# Patient Record
Sex: Female | Born: 2000
Health system: Southern US, Community
[De-identification: ages and names within clinical notes are randomized; demographics above are authoritative.]

## PROBLEM LIST (undated history)

## (undated) ENCOUNTER — Inpatient Hospital Stay (HOSPITAL_COMMUNITY): Payer: Medicaid Other

## (undated) DIAGNOSIS — D649 Anemia, unspecified: Secondary | ICD-10-CM

## (undated) HISTORY — DX: Anemia, unspecified: D64.9

## (undated) HISTORY — PX: NO PAST SURGERIES: SHX2092

---
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
## 2020-09-14 ENCOUNTER — Ambulatory Visit (INDEPENDENT_AMBULATORY_CARE_PROVIDER_SITE_OTHER): Payer: Medicaid Other | Admitting: Obstetrics

## 2020-09-14 ENCOUNTER — Encounter: Payer: Self-pay | Admitting: Obstetrics

## 2020-09-14 ENCOUNTER — Other Ambulatory Visit: Payer: Self-pay

## 2020-09-14 ENCOUNTER — Other Ambulatory Visit (HOSPITAL_COMMUNITY)
Admission: RE | Admit: 2020-09-14 | Discharge: 2020-09-14 | Disposition: A | Discharge status: Home / Self Care | Payer: Medicaid Other | Source: Ambulatory Visit | Attending: Obstetrics | Admitting: Obstetrics

## 2020-09-14 VITALS — BP 119/74 | HR 101 | Ht 66.0 in | Wt 174.1 lb

## 2020-09-14 DIAGNOSIS — Z3402 Encounter for supervision of normal first pregnancy, second trimester: Secondary | ICD-10-CM

## 2020-09-14 DIAGNOSIS — Z3A18 18 weeks gestation of pregnancy: Secondary | ICD-10-CM

## 2020-09-14 DIAGNOSIS — Z23 Encounter for immunization: Secondary | ICD-10-CM

## 2020-09-14 DIAGNOSIS — O99619 Diseases of the digestive system complicating pregnancy, unspecified trimester: Secondary | ICD-10-CM

## 2020-09-14 DIAGNOSIS — Z3400 Encounter for supervision of normal first pregnancy, unspecified trimester: Secondary | ICD-10-CM | POA: Diagnosis not present

## 2020-09-14 DIAGNOSIS — K5900 Constipation, unspecified: Secondary | ICD-10-CM

## 2020-09-14 MED ORDER — VITAFOL ULTRA 29-0.6-0.4-200 MG PO CAPS: 1.0000 | ORAL_CAPSULE | Freq: Every day | ORAL | 12 refills | Status: AC

## 2020-09-14 MED ORDER — BLOOD PRESSURE KIT DEVI: 1.0000 | 0 refills | Status: DC | PRN

## 2020-09-14 MED ORDER — DOCUSATE SODIUM 100 MG PO CAPS: 100.0000 mg | ORAL_CAPSULE | Freq: Two times a day (BID) | ORAL | 5 refills | Status: DC

## 2020-09-14 NOTE — Progress Notes (Signed)
Subjective:    Kari Dunn is being seen today for her first obstetrical visit.  This is not a planned pregnancy. She is at [redacted]w[redacted]d gestation. Her obstetrical history is significant for none. Relationship with FOB: significant other, not living together. Patient does intend to breast feed. Pregnancy history fully reviewed.  The information documented in the HPI was reviewed and verified.  Menstrual History: OB History    Gravida  1   Para      Term      Preterm      AB      Living  0     SAB      TAB      Ectopic      Multiple      Live Births               Patient's last menstrual period was 05/07/2020.    History reviewed. No pertinent past medical history.  Past Surgical History:  Procedure Laterality Date  . broken arm  2013    (Not in a hospital admission)  No Known Allergies  Social History   Tobacco Use  . Smoking status: Never Smoker  Substance Use Topics  . Alcohol use: Not Currently    Family History  Problem Relation Age of Onset  . Hypertension Mother      Review of Systems Constitutional: negative for weight loss Gastrointestinal: negative for vomiting.  Positive for constipation Genitourinary:negative for genital lesions and vaginal discharge and dysuria Musculoskeletal:negative for back pain Behavioral/Psych: negative for abusive relationship, depression, illegal drug usage and tobacco use    Objective:    BP 119/74   Pulse (!) 101   Ht $R'5\' 6"'YD$  (1.676 m)   Wt 174 lb 1.6 oz (79 kg)   LMP 05/07/2020   BMI 28.10 kg/m  General Appearance:    Alert, cooperative, no distress, appears stated age  Head:    Normocephalic, without obvious abnormality, atraumatic  Eyes:    PERRL, conjunctiva/corneas clear, EOM's intact, fundi    benign, both eyes  Ears:    Normal TM's and external ear canals, both ears  Nose:   Nares normal, septum midline, mucosa normal, no drainage    or sinus tenderness  Throat:   Lips, mucosa, and tongue normal;  teeth and gums normal  Neck:   Supple, symmetrical, trachea midline, no adenopathy;    thyroid:  no enlargement/tenderness/nodules; no carotid   bruit or JVD  Back:     Symmetric, no curvature, ROM normal, no CVA tenderness  Lungs:     Clear to auscultation bilaterally, respirations unlabored  Chest Wall:    No tenderness or deformity   Heart:    Regular rate and rhythm, S1 and S2 normal, no murmur, rub   or gallop  Breast Exam:    No tenderness, masses, or nipple abnormality  Abdomen:     Soft, non-tender, bowel sounds active all four quadrants,    no masses, no organomegaly  Genitalia:    Normal female without lesion, discharge or tenderness  Extremities:   Extremities normal, atraumatic, no cyanosis or edema  Pulses:   2+ and symmetric all extremities  Skin:   Skin color, texture, turgor normal, no rashes or lesions  Lymph nodes:   Cervical, supraclavicular, and axillary nodes normal  Neurologic:   CNII-XII intact, normal strength, sensation and reflexes    throughout      Lab Review Urine pregnancy test Labs reviewed yes Radiologic studies reviewed yes  Assessment:    Pregnancy at 42w4dweeks    Plan:      1. Supervision of normal first pregnancy, antepartum Rx: - Prenat-Fe Poly-Methfol-FA-DHA (VITAFOL ULTRA) 29-0.6-0.4-200 MG CAPS; Take 1 tablet by mouth daily.  Dispense: 30 capsule; Refill: 12 - Flu Vaccine QUAD 36+ mos IM (Fluarix, Quad PF) - CBC/D/Plt+RPR+Rh+ABO+Rub Ab... - Culture, OB Urine - Genetic Screening - Cervicovaginal ancillary only( [place]) - Babyscripts Schedule Optimization - [place] OB DETAIL +14 WK; Future - Blood Pressure Monitoring (BLOOD PRESSURE KIT) DEVI; 1 kit by Does not apply route as needed.  Dispense: 1 each; Refill: 0  2. Constipation during pregnancy, antepartum Rx: - docusate sodium (COLACE) 100 MG capsule; Take 1 capsule (100 mg total) by mouth 2 (two) times daily.  Dispense: 60 capsule; Refill: 5  Prenatal vitamins.   Counseling provided regarding continued use of seat belts, cessation of alcohol consumption, smoking or use of illicit drugs; infection precautions i.e., influenza/TDAP immunizations, toxoplasmosis,CMV, parvovirus, listeria and varicella; workplace safety, exercise during pregnancy; routine dental care, safe medications, sexual activity, hot tubs, saunas, pools, travel, caffeine use, fish and methlymercury, potential toxins, hair treatments, varicose veins Weight gain recommendations per IOM guidelines reviewed: underweight/BMI< 18.5--> gain 28 - 40 lbs; normal weight/BMI 18.5 - 24.9--> gain 25 - 35 lbs; overweight/BMI 25 - 29.9--> gain 15 - 25 lbs; obese/BMI >30->gain  11 - 20 lbs Problem list reviewed and updated. FIRST/CF mutation testing/NIPT/QUAD SCREEN/fragile X/Ashkenazi Jewish population testing/Spinal muscular atrophy discussed: requested. Role of ultrasound in pregnancy discussed; fetal survey: requested. Amniocentesis discussed: not indicated.  Follow up in 4 weeks. 50% of 20 min visit spent on counseling and coordination of care.    HShelly Bombard MD 09/14/2020 11:00 AM

## 2020-09-14 NOTE — Progress Notes (Signed)
NOB in office with sister, late to care, no complaints, pregnancy confirmed in [place], pt reports that FOB is involved.

## 2020-09-15 LAB — CERVICOVAGINAL ANCILLARY ONLY
Bacterial Vaginitis (gardnerella): NEGATIVE
Candida Glabrata: NEGATIVE
Candida Vaginitis: POSITIVE — AB
Chlamydia: NEGATIVE
Comment: NEGATIVE
Comment: NEGATIVE
Comment: NEGATIVE
Comment: NEGATIVE
Comment: NEGATIVE
Comment: NORMAL
Neisseria Gonorrhea: NEGATIVE
Trichomonas: NEGATIVE

## 2020-09-16 ENCOUNTER — Other Ambulatory Visit: Payer: Self-pay | Admitting: Obstetrics

## 2020-09-16 DIAGNOSIS — B3731 Acute candidiasis of vulva and vagina: Secondary | ICD-10-CM

## 2020-09-16 DIAGNOSIS — B373 Candidiasis of vulva and vagina: Secondary | ICD-10-CM

## 2020-09-16 DIAGNOSIS — O99019 Anemia complicating pregnancy, unspecified trimester: Secondary | ICD-10-CM

## 2020-09-16 LAB — CBC/D/PLT+RPR+RH+ABO+RUB AB...
Antibody Screen: NEGATIVE
Basophils Absolute: 0 10*3/uL (ref 0.0–0.2)
Basos: 0 %
EOS (ABSOLUTE): 0.2 10*3/uL (ref 0.0–0.4)
Eos: 2 %
HCV Ab: <0.1 s/co ratio (ref 0.0–0.9)
HIV Screen 4th Generation wRfx: NONREACTIVE
Hematocrit: 32.3 % — ABNORMAL LOW (ref 34.0–46.6)
Hemoglobin: 11 g/dL — ABNORMAL LOW (ref 11.1–15.9)
Hepatitis B Surface Ag: NEGATIVE
Immature Grans (Abs): 0.1 10*3/uL (ref 0.0–0.1)
Immature Granulocytes: 1 %
Lymphocytes Absolute: 1.7 10*3/uL (ref 0.7–3.1)
Lymphs: 20 %
MCH: 26.3 pg — ABNORMAL LOW (ref 26.6–33.0)
MCHC: 34.1 g/dL (ref 31.5–35.7)
MCV: 77 fL — ABNORMAL LOW (ref 79–97)
Monocytes Absolute: 0.5 10*3/uL (ref 0.1–0.9)
Monocytes: 6 %
Neutrophils Absolute: 6.1 10*3/uL (ref 1.4–7.0)
Neutrophils: 71 %
Platelets: 329 10*3/uL (ref 150–450)
RBC: 4.18 x10E6/uL (ref 3.77–5.28)
RDW: 15.7 % — ABNORMAL HIGH (ref 11.7–15.4)
RPR Ser Ql: NONREACTIVE
Rh Factor: POSITIVE
Rubella Antibodies, IGG: <0.9 index — ABNORMAL LOW (ref >0.99)
WBC: 8.5 10*3/uL (ref 3.4–10.8)

## 2020-09-16 LAB — URINE CULTURE, OB REFLEX

## 2020-09-16 LAB — CULTURE, OB URINE

## 2020-09-16 LAB — HCV INTERPRETATION

## 2020-09-16 MED ORDER — TERCONAZOLE 0.4 % VA CREA: 1.0000 | TOPICAL_CREAM | Freq: Every day | VAGINAL | 0 refills | Status: DC

## 2020-09-16 MED ORDER — FERROUS SULFATE 325 (65 FE) MG PO TABS: 325.0000 mg | ORAL_TABLET | Freq: Two times a day (BID) | ORAL | 5 refills | Status: DC

## 2020-09-18 ENCOUNTER — Telehealth: Payer: Self-pay

## 2020-09-18 NOTE — Telephone Encounter (Signed)
Called patient to inform of yeast infection and the need to start medication.

## 2020-09-20 ENCOUNTER — Other Ambulatory Visit: Payer: Self-pay

## 2020-09-20 ENCOUNTER — Other Ambulatory Visit: Payer: Medicaid Other

## 2020-09-25 ENCOUNTER — Encounter: Payer: Self-pay | Admitting: Obstetrics

## 2020-09-27 ENCOUNTER — Encounter: Payer: Self-pay | Admitting: Obstetrics

## 2020-10-04 ENCOUNTER — Ambulatory Visit: Payer: Medicaid Other | Admitting: *Deleted

## 2020-10-04 ENCOUNTER — Ambulatory Visit: Payer: Medicaid Other | Attending: Obstetrics

## 2020-10-04 ENCOUNTER — Other Ambulatory Visit: Payer: Self-pay | Admitting: *Deleted

## 2020-10-04 ENCOUNTER — Other Ambulatory Visit: Payer: Self-pay

## 2020-10-04 ENCOUNTER — Encounter: Payer: Self-pay | Admitting: *Deleted

## 2020-10-04 VITALS — BP 121/71 | HR 92

## 2020-10-04 DIAGNOSIS — D649 Anemia, unspecified: Secondary | ICD-10-CM | POA: Diagnosis not present

## 2020-10-04 DIAGNOSIS — O0930 Supervision of pregnancy with insufficient antenatal care, unspecified trimester: Secondary | ICD-10-CM

## 2020-10-04 DIAGNOSIS — O99012 Anemia complicating pregnancy, second trimester: Secondary | ICD-10-CM

## 2020-10-04 DIAGNOSIS — Z3A21 21 weeks gestation of pregnancy: Secondary | ICD-10-CM

## 2020-10-04 DIAGNOSIS — O0932 Supervision of pregnancy with insufficient antenatal care, second trimester: Secondary | ICD-10-CM | POA: Diagnosis not present

## 2020-10-04 DIAGNOSIS — Z3400 Encounter for supervision of normal first pregnancy, unspecified trimester: Secondary | ICD-10-CM | POA: Insufficient documentation

## 2020-10-04 DIAGNOSIS — Z148 Genetic carrier of other disease: Secondary | ICD-10-CM

## 2020-10-04 DIAGNOSIS — Z363 Encounter for antenatal screening for malformations: Secondary | ICD-10-CM

## 2020-10-06 ENCOUNTER — Encounter: Payer: Self-pay | Admitting: Obstetrics

## 2020-10-12 ENCOUNTER — Ambulatory Visit (INDEPENDENT_AMBULATORY_CARE_PROVIDER_SITE_OTHER): Payer: Medicaid Other | Admitting: Nurse Practitioner

## 2020-10-12 ENCOUNTER — Encounter: Payer: Self-pay | Admitting: Nurse Practitioner

## 2020-10-12 ENCOUNTER — Other Ambulatory Visit: Payer: Self-pay

## 2020-10-12 VITALS — BP 115/74 | HR 93 | Wt 185.0 lb

## 2020-10-12 DIAGNOSIS — D563 Thalassemia minor: Secondary | ICD-10-CM | POA: Insufficient documentation

## 2020-10-12 DIAGNOSIS — Z3400 Encounter for supervision of normal first pregnancy, unspecified trimester: Secondary | ICD-10-CM

## 2020-10-12 DIAGNOSIS — Z3A22 22 weeks gestation of pregnancy: Secondary | ICD-10-CM

## 2020-10-12 DIAGNOSIS — O99619 Diseases of the digestive system complicating pregnancy, unspecified trimester: Secondary | ICD-10-CM

## 2020-10-12 DIAGNOSIS — K5900 Constipation, unspecified: Secondary | ICD-10-CM

## 2020-10-12 NOTE — Progress Notes (Signed)
Pt presents for ROB without complaints today.  

## 2020-10-12 NOTE — Progress Notes (Signed)
q   Subjective:  Kari Dunn is a 19 y.o. G1P0 at [redacted]w[redacted]d being seen today for ongoing prenatal care.  She is currently monitored for the following issues for this low-risk pregnancy and has Supervision of normal first pregnancy, antepartum and Alpha thalassemia silent carrier on their problem list.  Patient reports no complaints.  Contractions: Not present. Vag. Bleeding: None.  Movement: Present. Denies leaking of fluid.   The following portions of the patient's history were reviewed and updated as appropriate: allergies, current medications, past family history, past medical history, past social history, past surgical history and problem list. Problem list updated.  Objective:   Vitals:   10/12/20 0850  BP: 115/74  Pulse: 93  Weight: 185 lb (83.9 kg)    Fetal Status: Fetal Heart Rate (bpm): 145 Fundal Height: 22 cm Movement: Present     General:  Alert, oriented and cooperative. Patient is in no acute distress.  Skin: Skin is warm and dry. No rash noted.   Cardiovascular: Normal heart rate noted  Respiratory: Normal respiratory effort, no problems with respiration noted  Abdomen: Soft, gravid, appropriate for gestational age. Pain/Pressure: Absent     Pelvic:  Cervical exam deferred        Extremities: Normal range of motion.  Edema: None  Mental Status: Normal mood and affect. Normal behavior. Normal judgment and thought content.   Urinalysis:      Assessment and Plan:  Pregnancy: G1P0 at [redacted]w[redacted]d  1. Supervision of normal first pregnancy, antepartum Has not picked up BP cuff yet Encouraged to sign up for breastfeeding and childbirth classes Too late for AFP Discussed Alpha Thal carrier - Advised to call [employer] to set up partner testing  2. Constipation during pregnancy, antepartum Improving Drink at least 8 8-oz glasses of water every day. Eat high fiber diet  3. Alpha Thal Silent Carrier  Preterm labor symptoms and general obstetric precautions including but not  limited to vaginal bleeding, contractions, leaking of fluid and fetal movement were reviewed in detail with the patient. Please refer to After Visit Summary for other counseling recommendations.  Return in about 3 weeks (around 11/02/2020) for in person ROB.  Nolene Bernheim, RN, MSN, NP-BC Nurse Practitioner, [facility] for [employer], [facility] 10/12/2020 4:14 PM

## 2020-10-12 NOTE — Patient Instructions (Signed)
AREA PEDIATRIC/FAMILY PRACTICE PHYSICIANS  [place] ([address]) . [facility] o Chambliss, MD; Eniola, MD; Hale, MD; Hensel, MD; McDiarmid, MD; McIntyer, MD; Neal, MD; Walden, MD o [address] [place], [place] [address] o [phone] o Mon-Fri 8:30-12:30, 1:30-5:00 o Providers come to see babies at [facility] o Accepting Medicaid . [facility] o Limited providers who accept newborns: Koirala, MD; Morrow, MD; Wolters, MD o [address], [place], [place] [address] o [phone] o Mon-Fri 8:00-5:30 o Babies seen by providers at [facility] o Does NOT accept Medicaid o Please call early in hospitalization for appointment (limited availability)  . [facility] o Mulberry, MD o [address] [place], [place] [address] o [phone] o Mon, Tue, Thur, Fri 8:30-5:00, Wed 10:00-7:00 (closed 1-2pm) o Babies seen by [facility] providers o Accepting Medicaid . [facility] - Pediatrician o Rubin, MD o [address], [place], [place] [address] o [phone] o Mon-Fri 8:30-5:00, Sat 8:30-12:00 o Provider comes to see babies at [facility] o Accepting Medicaid o Must have been referred from current patients or contacted office prior to delivery . [facility] ([facility] o Brown, MD; Chandler, MD; Ettefagh, MD; Grant, MD; Lester, MD; McCormick, MD; McQueen, MD; Prose, MD; Simha, MD; Stanley, MD; Stryffeler, NP; Tebben, NP o [address], [place], [place] [address] o [phone] o Mon, Tue, Thur, Fri 8:30-5:30, Wed 9:30-5:30, Sat 8:30-12:30 o Babies seen by [facility] providers o Accepting Medicaid o Only accepting infants of first-time parents or siblings of current patients o Hospital discharge coordinator will make follow-up appointment . Jack Amos o [address],  [place], [place]  [address] o [phone]   Fax - [phone] . [facility] o [address], [place], [place]  [address] o Phone - [phone]   Fax - [phone] . Shilpa Gosrani o [address], [place], [place]  [address] o [phone]  [place] ([address]) . [facility] o Bates, MD; [place], MD; Cooper, Cox, MD; MD; Davis, MD; Dovico, MD; Ettefaugh, MD; Little, MD; Lowe, MD; Keiffer, MD; Melvin, MD; Sumner, MD; Williams, MD o [address], [place], [place] [address] o [phone] o Mon-Fri 8:30-5:00 (extended evenings Mon-Thur as needed), Sat-Sun 10:00-1:00 o Providers come to see babies at [facility] o Accepting Medicaid for families of first-time babies and families with all children in the household age 3 and under. Must register with office prior to making appointment (M-F only). . [facility] o Henson, NP; Knapp, MD; Lalonde, MD; Tysinger, PA o [address] [place], [place] [address] o [phone] o Mon-Fri 8:00-5:00 o Babies seen by providers at [facility] o Does NOT accept Medicaid/Commercial Insurance Only . [facility] [place] ([facility]  o Artis, MD; Barnes, MD; Bratton, MD; Coccaro, MD; Lockett Gardner, MD; Kramer, MD; Marshall, MD; Netherton, MD; Poleto, MD; Skinner, MD o [address] [place], [place] [address] o [phone] o Mon-Fri 8:30-5:30, Sat (Oct.-Mar.) 9:00-1:00 o Babies seen by providers at [facility] o Accepting Medicaid  [place] ([address]) . [facility] o Reid, MD; Warner, MD o [address], [place],  [address] o [phone] o Mon-Fri 8:30-5:00, Sat 8:30-12:00 o Providers come to see babies at [facility] o Does NOT accept Medicaid . [facility] o Becker, PA; Hagler, MD; Scifres, PA; Sun, MD; Swayne, MD o [address],  [place], [place] [address] o [phone] o Mon-Fri 8:00-5:00 o Babies seen by providers at [facility] o Does NOT accept Medicaid o Only accepting babies of parents who are patients o Please call early in hospitalization for appointment (limited availability) . [facility] o Clark, MD; Frye, MD; Kelleher, MD; Mack, NP; Miller, MD; O'Keller, MD; Patterson, NP; Pudlo, MD; Puzio, MD; Thomas, MD; Tucker, MD; Twiselton, MD o [address], [place], [place] [address] o [phone] o Mon-Fri 8:00-5:00, Sat 9:00-12:00 o Providers come to see babies at [facility] o Does NOT accept Medicaid  [place] ([address]) . [facility] o Limited providers accepting new patients: Brake, NP; Wharton, PA o [address], [place], [place] [address] o [phone] o Mon-Fri 8:00-5:00 o Babies seen by providers at [facility] o Does NOT accept Medicaid o Only accepting babies of parents who are patients o Please call early in hospitalization for appointment (limited availability) . [facility] o Gay, MD; Quinlan, MD o [address] [place], [place] [address] o [phone] (press 1 to schedule appointment) o Mon-Fri 8:00-5:00 o Providers come to see babies at [facility] o Does NOT accept Medicaid . [facility] o Mazer, MD o [address] [place], [place] [address] o [phone] o Mon-Fri 8:30-5:00 (lunch 12:30-1:00), extended hours by appointment only Wed 5:00-6:30 o Babies seen by [facility] providers o Accepting Medicaid . [facility] o Banks, MD; Jordan, MD; Koberlein, MD o [address], [place], [place] [address] o [phone] o Mon-Fri 8:00-5:00 o Babies seen by [facility] providers o Does NOT accept Medicaid . [facility] o Parker, MD; Hunter, MD; Wallace, DO o [address] [place], [place]  [address] o [phone] o Mon-Fri 8:00-5:00 o Babies seen by [facility] providers o Does NOT accept Medicaid . [facility] o Brandon, PA; Brecken, PA; Christy, NP; Dees, MD; DeClaire, MD; DeWeese, MD; Hansen, NP; Mills, NP; Parrish, NP; Smoot, NP; Summer, MD; Vapne, MD o [address] [place], [place] [address] o [phone] o Mon-Fri 8:30-5:00, Sat 10:00-1:00 o Providers come to see babies at [facility] o Does NOT accept Medicaid o Free prenatal information session Tuesdays at 4:45pm . [facility] o Bouska, MD; Gordon, PA; Jeffery, PA; Weber, PA o [address] [place] [address] o [phone] o Mon-Fri 7:30-5:30 o Babies seen by [facility] providers . [facility] o [address], [place], [place], [place]  [address] o [phone]   Fax - [phone]  [place] ([phone]) . [facility] o Reese, [place] o [address] [place], [place] [address] o [phone] o Mon-Thur 8:00-6:00 o Providers come to see babies at [facility] o Accepting Medicaid . [facility] o Anderson, NP; Badger, MD; Beal, PA; Spencer, PA o [address] [place],  [address] o [phone] o Mon-Thur 7:30-7:30, Fri 7:30-4:30 o Babies seen by [facility] providers o Accepting Medicaid . [facility] o Agbuya, MD; Klett, NP; Romgoolam, MD o [address], ,  [address] o [phone] o Mon-Fri 8:30-5:00, Sat 8:30-12:00 o Providers come to see babies at [facility] o Accepting Medicaid o Must have "Meet & Greet" appointment at office prior to delivery . [facility]  ([facility] ) o McCord,   MD; Juleen China, MD; Clydene Laming, [address], [place], [place] [address] o [phone] o Mon-Wed 8:00-6:00, Thur-Fri 8:00-5:00, Sat 9:00-12:00 o Providers come to  see babies at [facility] o Does NOT accept Medicaid o Only accepting siblings of current patients . [facility] [place], [place], [place], [place]  [address] o [phone]   Fax [phone] . [facility] [place] [address], [place], [place]  [address] o [phone]   Fax - Morton Gorman [phone] & [phone]) . Therapist, music at [place], DO; [place], [address] [place], [place] [address] o [phone] o Mon-Fri 7:00-5:00 o Babies seen by [facility] providers o Does NOT accept Medicaid . [place], MD; [place], [place]; [place], [place], [place], [place] [address] o [phone] o Mon-Fri 8:00-5:00 o Babies seen by [facility] providers o Accepting Medicaid . Lamont, MD; [place], [place]; [place], NP; [place], [place], [place], [place] [address] o [phone] o Mon-Fri 8:00-5:00 o Babies seen by providers at [facility] [place] [phone]) . [facility] [place], [place] o [address] [place], [place] [address] o [phone] o Mon-Fri 8:00-5:00 o Babies seen by [facility] providers o Does NOT accept Medicaid o Limited availability, please call early in hospitalization to schedule follow-up . [facility] Leilani Merl, PA; Maisie Fus, MD; [place], MD; [place], [place]; Jeannine Kitten, MD; [place], [place] [facility] [address], [place], [place] [address] o [phone] o Mon-Fri 8:30-5:00, Sat 9:00-12:00 o Babies seen by providers at [facility] o Accepting Medicaid o Please register online then schedule online or call office o [email or web address] . [place] [facility]  [place]) Kristian Covey, NP; Dwyane Dee, MD; Leonidas Romberg, PA o [address] [place], [place], [place] [address] o [phone] o Mon-Fri 8:00-5:00 o Babies seen by providers at [facility] o Accepting Medicaid . [place] ([facility] [employer]) Dairl Ponder, MD; Rayvon Char, NP; Melina Modena, MD o [address] [place], [place], [place] [address] o [phone] o Mon-Fri 8:00-5:30, Sat&Sun by appointment (phones open at 8:30) o Babies seen by [facility] providers o Accepting Medicaid o Must be a first-time baby or sibling of current patient . [place], [address], [place], [place]  [address] o [phone]   Fax - [phone]  [place] [phone] & [phone]) . [place], [place]; [place], [place]; Benjamine Mola, MD; [place], [place]; Harrell Lark, MD o [address] [place], [place] [address] o [phone] o Mon-Thur 8:00-7:00, Fri 8:00-5:00, Sat 8:00-12:00, Sun 9:00-12:00 o Babies seen by [facility] providers o Accepting Medicaid . [facility] at [facility], MD; Ruthann Cancer, MD; [facility], MD o [address], [place], [place] [address] o [phone] o Mon-Thur 8:00-5:00 o Babies seen by providers at [facility] o Accepting Medicaid . [facility] [place], MD; [place], MD; Amedeo Plenty, MD; Bobby Rumpf, MD; List, MD; Lavonia Drafts, MD; Ruthann Cancer, MD; Selinda Eon, MD; Audie Box, MD; Jim Like, MD; Christie Nottingham, MD; Hubbard Hartshorn, MD; Modena Nunnery, MD o [address] [place], [place]  [address] o [phone] o Mon-Fri 8:00-5:30, Sat (Oct.-Mar.) 9:00-1:00 o Babies seen by providers at [facility] o Accepting Medicaid o Must fill out new patient packet, available online at [email or web address] . [facility] [place] [facility]) Simone Curia, NP; Tiburcio Pea, NP; Tresa Endo, NP; Whitney Post, MD;  [place], [place]; Hennie Duos, MD; Wynne Dust, MD; Kavin Leech, NP o [address], [place], [place] [address] o [phone] o Mon-Thur 8:00-5:30, Fri 8:00-5:00 o Babies seen by providers at [facility] o Accepting [facility] [phone]) . [facility] o [place], [place]; D[place]bach, MD; Tanya Nones, MD; [place], [place] o [address] [place], [place] [address] o [phone] o Mon-Fri 8:00-5:00 o Babies seen by providers at [facility] o Accepting [facility] [phone]) . [facility] o [place], DO; Lenise Arena, MD; [place], [place] o [address], [place], [place] [address] o [phone] o Mon-Fri 8:00-5:00 o Babies seen by providers at [facility] o Does NOT accept Medicaid o Limited appointment availability, please call early in hospitalization  . Nature conservation officer at [facility] o [place], DO; [place], MD o [address], [place], [place] [address] o [phone] o Mon-Fri 8:00-5:00 o Babies seen by [facility] providers o Does NOT accept Medicaid . [facility] Lorrine Kin, MD; Ninetta Lights, MD; [place], [place]; [place], MD o [address], [place], [place] [address] o [phone] o Mon-Fri 8:00-5:00 o After hours clinic [facility] [place], [place] [address]) [phone] Mon-Fri 5:00-8:00, Sat 12:00-6:00, Sun 10:00-4:00 o Babies seen by [facility] providers o Accepting Medicaid . [facility] at [facility] o [address] [place], [place]  [address] o [phone]   Fax - [phone]  [place] [phone]) . Nature conservation officer at [facility], MD o [address] [place], [place], [place] [address] o [phone] o Mon-Fri 8:00-5:00 o Babies seen by [facility] providers o Does NOT accept Medicaid . [facility] [place] [facility] [place]) Tomi Likens, MD o [address] [place] [address], [place], [place]  [address] o [phone] o Mon-Thur 8:00-7:00, Fri 8:00-5:00, Sat 8:00-12:00 o Babies seen by providers at [facility] o Accepting Medicaid - but does not have vaccinations in office (must be received elsewhere) o Limited availability, please call early in hospitalization  [place] ([address]) . [facility]  o Wyvonne Lenz, MD o [address], [place] [address] o [phone]  Fax [phone]   Check out Covid vaccine info here:   [email or web address]  7 Things You Should Know About the COVID-19 Vaccines  Here are seven facts you should know before taking your shot:  1.  No serious side effects were reported in clinical trials. Temporary reactions after receiving the vaccine may include a sore arm, headache, feeling tired and achy for a day or two or, in some cases, fever. In most cases, these reactions are good signs that your body is building protection.   2.  Scientists had a head start. They are built on decades of research on vaccines for similar viruses. A big investment of resources and focus made sure they were created without skipping any steps in development, testing, or clinical trials.  3. You cannot get COVID-19 from the vaccine. The vaccine gives your body instructions to make a protein that safely teaches you to make germ-fighting antibodies to fight the real COVID-19.  4.The vaccine  protects against the Delta variant. The Delta variant, which is now predominant in [place], is much more contagious than the original virus. Vaccines continue to be remarkably effective in reducing risk of severe disease, hospitalization, and death, even against the Delta variant.  5. A hundred million people in the [place] have already received their COVID-19 vaccine.  6. It works. And once you're fully vaccinated you're protected. The vaccines are proven to help prevent COVID-19 and are effective in preventing hospitalization and death.   7. The  vaccine does not affect fertility. Vaccination for those who are pregnant or wanting to become pregnant is recommended by the [employer] of Obstetricians and Gynecologists (ACOG), the Society for Maternal-Fetal Medicine (SMFM), the American Society for Reproductive Medicine (ASRM), and the Society for Female Reproduction and Urology.   TDaP Vaccine Pregnancy Get the Whooping Cough Vaccine While You Are Pregnant (CDC)  It is important for women to get the whooping cough vaccine in the third trimester of each pregnancy. Vaccines are the best way to prevent this disease. There are 2 different whooping cough vaccines. Both vaccines combine protection against whooping cough, tetanus and diphtheria, but they are for different age groups: Tdap: for everyone 11 years or older, including pregnant women  DTaP: for children 2 months through 16 years of age  You need the whooping cough vaccine during each of your pregnancies The recommended time to get the shot is during your 27th through 36th week of pregnancy, preferably during the earlier part of this time period. The Centers for Disease Control and Prevention (CDC) recommends that pregnant women receive the whooping cough vaccine for adolescents and adults (called Tdap vaccine) during the third trimester of each pregnancy. The recommended time to get the shot is during your 27th through 36th week of pregnancy, preferably during the earlier part of this time period. This replaces the original recommendation that pregnant women get the vaccine only if they had not previously received it. The [employer] of Obstetricians and Gynecologists and the [employer] support this recommendation.  You should get the whooping cough vaccine while pregnant to pass protection to your baby frame support disabled and/or not supported in this browser  Learn why Vernona Rieger decided to get the whooping cough vaccine in her 3rd trimester of pregnancy and  how her baby girl was born with some protection against the disease. Also available on YouTube. After receiving the whooping cough vaccine, your body will create protective antibodies (proteins produced by the body to fight off diseases) and pass some of them to your baby before birth. These antibodies provide your baby some short-term protection against whooping cough in early life. These antibodies can also protect your baby from some of the more serious complications that come along with whooping cough. Your protective antibodies are at their highest about 2 weeks after getting the vaccine, but it takes time to pass them to your baby. So the preferred time to get the whooping cough vaccine is early in your third trimester. The amount of whooping cough antibodies in your body decreases over time. That is why CDC recommends you get a whooping cough vaccine during each pregnancy. Doing so allows each of your babies to get the greatest number of protective antibodies from you. This means each of your babies will get the best protection possible against this disease.  Getting the whooping cough vaccine while pregnant is better than getting the vaccine after you give birth Whooping cough vaccination during pregnancy  is ideal so your baby will have short-term protection as soon as he is born. This early protection is important because your baby will not start getting his whooping cough vaccines until he is 2 months old. These first few months of life are when your baby is at greatest risk for catching whooping cough. This is also when he's at greatest risk for having severe, potentially life-threating complications from the infection. To avoid that gap in protection, it is best to get a whooping cough vaccine during pregnancy. You will then pass protection to your baby before he is born. To continue protecting your baby, he should get whooping cough vaccines starting at 2 months old. You may never have gotten  the Tdap vaccine before and did not get it during this pregnancy. If so, you should make sure to get the vaccine immediately after you give birth, before leaving the hospital or birthing center. It will take about 2 weeks before your body develops protection (antibodies) in response to the vaccine. Once you have protection from the vaccine, you are less likely to give whooping cough to your newborn while caring for him. But remember, your baby will still be at risk for catching whooping cough from others. A recent study looked to see how effective Tdap was at preventing whooping cough in babies whose mothers got the vaccine while pregnant or in the hospital after giving birth. The study found that getting Tdap between 27 through 36 weeks of pregnancy is 85% more effective at preventing whooping cough in babies younger than 2 months old. Blood tests cannot tell if you need a whooping cough vaccine There are no blood tests that can tell you if you have enough antibodies in your body to protect yourself or your baby against whooping cough. Even if you have been sick with whooping cough in the past or previously received the vaccine, you still should get the vaccine during each pregnancy. Breastfeeding may pass some protective antibodies onto your baby By breastfeeding, you may pass some antibodies you have made in response to the vaccine to your baby. When you get a whooping cough vaccine during your pregnancy, you will have antibodies in your breast milk that you can share with your baby as soon as your milk comes in. However, your baby will not get protective antibodies immediately if you wait to get the whooping cough vaccine until after delivering your baby. This is because it takes about 2 weeks for your body to create antibodies. Learn more about the health benefits of breastfeeding.

## 2020-11-02 ENCOUNTER — Encounter: Payer: Self-pay | Admitting: Nurse Practitioner

## 2020-11-02 ENCOUNTER — Other Ambulatory Visit: Payer: Self-pay

## 2020-11-02 ENCOUNTER — Ambulatory Visit (INDEPENDENT_AMBULATORY_CARE_PROVIDER_SITE_OTHER): Payer: Medicaid Other | Admitting: Nurse Practitioner

## 2020-11-02 VITALS — BP 119/83 | HR 91 | Wt 195.2 lb

## 2020-11-02 DIAGNOSIS — Z3400 Encounter for supervision of normal first pregnancy, unspecified trimester: Secondary | ICD-10-CM

## 2020-11-02 DIAGNOSIS — O99619 Diseases of the digestive system complicating pregnancy, unspecified trimester: Secondary | ICD-10-CM

## 2020-11-02 DIAGNOSIS — K5900 Constipation, unspecified: Secondary | ICD-10-CM

## 2020-11-02 DIAGNOSIS — R12 Heartburn: Secondary | ICD-10-CM

## 2020-11-02 DIAGNOSIS — Z3A25 25 weeks gestation of pregnancy: Secondary | ICD-10-CM

## 2020-11-02 DIAGNOSIS — O26899 Other specified pregnancy related conditions, unspecified trimester: Secondary | ICD-10-CM

## 2020-11-02 MED ORDER — OMEPRAZOLE 20 MG PO CPDR: 20.0000 mg | DELAYED_RELEASE_CAPSULE | Freq: Every day | ORAL | 1 refills | Status: DC

## 2020-11-02 NOTE — Patient Instructions (Signed)
[email or web address] for childbirth and breastfeeding classes °

## 2020-11-02 NOTE — Progress Notes (Signed)
Patient presents for ROB. Patient has no concerns today.  PHQ2 score: 0

## 2020-11-02 NOTE — Progress Notes (Signed)
    Subjective:  Kari Dunn is a 19 y.o. G1P0 at [redacted]w[redacted]d being seen today for ongoing prenatal care.  She is currently monitored for the following issues for this low-risk pregnancy and has Supervision of normal first pregnancy, antepartum and Alpha thalassemia silent carrier on their problem list.  Patient reports no complaints.  Contractions: Not present. Vag. Bleeding: None.  Movement: Present. Denies leaking of fluid.   The following portions of the patient's history were reviewed and updated as appropriate: allergies, current medications, past family history, past medical history, past social history, past surgical history and problem list. Problem list updated.  Objective:   Vitals:   11/02/20 0826  BP: 119/83  Pulse: 91  Weight: 195 lb 3.2 oz (88.5 kg)    Fetal Status: Fetal Heart Rate (bpm): 145   Movement: Present     General:  Alert, oriented and cooperative. Patient is in no acute distress.  Skin: Skin is warm and dry. No rash noted.   Cardiovascular: Normal heart rate noted  Respiratory: Normal respiratory effort, no problems with respiration noted  Abdomen: Soft, gravid, appropriate for gestational age. Pain/Pressure: Absent     Pelvic:  Cervical exam deferred        Extremities: Normal range of motion.  Edema: None  Mental Status: Normal mood and affect. Normal behavior. Normal judgment and thought content.   Urinalysis:      Assessment and Plan:  Pregnancy: G1P0 at [redacted]w[redacted]d  1. Supervision of normal first pregnancy, antepartum Covid vaccine appointment 11-09-20 Advised to sign up for childbirth and breastfeeding classes Advised to look for pediatrician Has not yet picked up BP cuff.  Advised to enter BP into babyscripts. Reviewed appointment for glucola testing next visit - reviewed early am and fasting Reports she has discussed Alpha Thal Carrier status with provider and has no questions  2. Heartburn during pregnancy, antepartum Prescribed medication as she has  been trying Tums and does not get relief  3. Constipation during pregnancy, antepartum Improved and no longer a problem  Preterm labor symptoms and general obstetric precautions including but not limited to vaginal bleeding, contractions, leaking of fluid and fetal movement were reviewed in detail with the patient. Please refer to After Visit Summary for other counseling recommendations.  Return in about 3 weeks (around 11/23/2020) for early AM appt for ROB and fasting glucose.  Nolene Bernheim, RN, MSN, NP-BC Nurse Practitioner, [facility] for [employer], [facility] 11/02/2020 8:51 AM

## 2020-11-04 NOTE — L&D Delivery Note (Addendum)
Delivery Note Velicia Dejager is a 20 y.o. female G1P0 with IUP at [redacted]w[redacted]d admitted for IOL for GHTN.   At  0740 a viable and healthy female was delivered via vaginal (Presentation:vertex ; LOA  ).  She progressed with augmentation to complete and pushed spontaneously to deliver.    APGAR:8 , 9; weight  pending at time of this note.    Cord clamping delayed by several minutes then clamped by SNM and cut by Dad.   Placenta intact and spontaneous, bleeding minimal.    Cord pH: n/a Anesthesia:  Epidural Episiotomy:  n/a Lacerations:   Left labial Suture Repair: no repair Est. Blood Loss (mL):   Mom and baby stable prior to transfer  to postpartum.  Baby to Couplet care / Skin to Skin. She plans on breastfeeding. She is unsure method of birth control.   [facility] [employer] 01/22/2021, 7:57 AM   Midwife attestation: I was gloved and present for delivery in its entirety and I agree with the above student midwife's note.  Chayce Rullo is a 20 y.o. G1P0 s/p SVD at [redacted]w[redacted]d. She was admitted for IOL for GHTN.   ROM: 7h 75m with clear fluid GBS Status: Negative Maximum Maternal Temperature: 98.5  Labor Progress and delivery: . She progressed with augmentation (FB, cytotec, AROM, Pitocin) to complete and pushed to deliver. Head delivered LOA. No nuchal cord present. Shoulder and body delivered in usual fashion. Infant with spontaneous cry, placed on mother's abdomen, dried and stimulated. Cord clamped x 2 after 1-minute delay, and cut by FOB. Cord blood drawn. Placenta delivered spontaneously with gentle cord traction. Fundus firm with massage and Pitocin. Labia, perineum, vagina, and cervix inspected inspected with left labial laceration.  Mom and baby stable prior to transfer to postpartum. She plans on breastfeeding. She is unsure of method for birth control.   Sharyon Cable, CNM 8:29 AM

## 2020-11-08 ENCOUNTER — Ambulatory Visit: Payer: Medicaid Other | Admitting: *Deleted

## 2020-11-08 ENCOUNTER — Other Ambulatory Visit: Payer: Self-pay | Admitting: *Deleted

## 2020-11-08 ENCOUNTER — Ambulatory Visit: Payer: Medicaid Other | Attending: Obstetrics and Gynecology

## 2020-11-08 ENCOUNTER — Encounter: Payer: Self-pay | Admitting: *Deleted

## 2020-11-08 ENCOUNTER — Other Ambulatory Visit: Payer: Self-pay

## 2020-11-08 VITALS — BP 120/76 | HR 107

## 2020-11-08 DIAGNOSIS — Z362 Encounter for other antenatal screening follow-up: Secondary | ICD-10-CM

## 2020-11-08 DIAGNOSIS — D649 Anemia, unspecified: Secondary | ICD-10-CM | POA: Diagnosis not present

## 2020-11-08 DIAGNOSIS — O0930 Supervision of pregnancy with insufficient antenatal care, unspecified trimester: Secondary | ICD-10-CM | POA: Diagnosis not present

## 2020-11-08 DIAGNOSIS — Z363 Encounter for antenatal screening for malformations: Secondary | ICD-10-CM

## 2020-11-08 DIAGNOSIS — O0932 Supervision of pregnancy with insufficient antenatal care, second trimester: Secondary | ICD-10-CM | POA: Diagnosis not present

## 2020-11-08 DIAGNOSIS — O99012 Anemia complicating pregnancy, second trimester: Secondary | ICD-10-CM | POA: Diagnosis not present

## 2020-11-08 DIAGNOSIS — Z148 Genetic carrier of other disease: Secondary | ICD-10-CM | POA: Diagnosis not present

## 2020-11-08 DIAGNOSIS — Z3A26 26 weeks gestation of pregnancy: Secondary | ICD-10-CM

## 2020-11-10 ENCOUNTER — Other Ambulatory Visit: Payer: Self-pay

## 2020-11-10 ENCOUNTER — Inpatient Hospital Stay (HOSPITAL_COMMUNITY)
Admission: AD | Admit: 2020-11-10 | Discharge: 2020-11-10 | Disposition: A | Discharge status: Home / Self Care | Payer: Medicaid Other | Attending: Obstetrics & Gynecology | Admitting: Obstetrics & Gynecology

## 2020-11-10 DIAGNOSIS — O99891 Other specified diseases and conditions complicating pregnancy: Secondary | ICD-10-CM

## 2020-11-10 DIAGNOSIS — R519 Headache, unspecified: Secondary | ICD-10-CM | POA: Insufficient documentation

## 2020-11-10 DIAGNOSIS — Z3A26 26 weeks gestation of pregnancy: Secondary | ICD-10-CM | POA: Diagnosis not present

## 2020-11-10 DIAGNOSIS — O26892 Other specified pregnancy related conditions, second trimester: Secondary | ICD-10-CM | POA: Diagnosis not present

## 2020-11-10 MED ORDER — ACETAMINOPHEN 500 MG PO TABS
1000.0000 mg | ORAL_TABLET | Freq: Once | ORAL | Status: AC
  Administered 2020-11-10: 1000 mg via ORAL
  Filled 2020-11-10: qty 2

## 2020-11-10 NOTE — MAU Note (Addendum)
PT SAYS HAS H/A DURING DAY- LAST 10-15 MIN - THEN COME BACK.  AND HAS BEEN HAPPENING X2 WEEKS  .  SAYS ON MON LOOSE STOOL .  HAS H/A NOW- 4/10.   HAS NOT BEEN TESTED  FOR COVID- HAS NOT BEEN VACCINATED.  PNC WITH  FAMINA .  NEXT APPOINTMENT IS 1-20. DID NOT TAKE ANYTHING FOR H/A.

## 2020-11-10 NOTE — MAU Provider Note (Signed)
Event Date/Time   First Provider Initiated Contact with Patient 11/10/20 2115      S Ms. Jayel Scaduto is a 20 y.o. G1P0 patient who presents to MAU today with complaint of headache daily throughout the day.  She states she has not taken anything for her symptoms and reports the headaches last only 10-15 minutes.  She reports getting 6-12 of these HA daily.  Patient reports drinking 2-3 bottles of water daily. She denies any aggravating or relieving factors.  However, she reports the HA onset is usually noted while watching television.  Patient states she has not had an eye exam in about 5 years. She endorses fetal movement and denies abdominal cramping or contractions. She also reports that she has been exposed to Covid as she lives in a household with a Covid positive patient.   O BP 121/74 (BP Location: Right Arm)   Pulse (!) 103   Temp 98.2 F (36.8 C) (Oral)   Resp 20   Ht 5\' 7"  (1.702 m)   Wt 88.2 kg   LMP 05/07/2020   BMI 30.46 kg/m  Physical Exam Constitutional:      Appearance: Normal appearance.  HENT:     Head: Normocephalic and atraumatic.  Eyes:     Conjunctiva/sclera: Conjunctivae normal.  Cardiovascular:     Rate and Rhythm: Regular rhythm. Tachycardia present.  Pulmonary:     Effort: Pulmonary effort is normal. No respiratory distress.     Breath sounds: Normal breath sounds.  Abdominal:     General: There is no distension.     Comments: Gravid, Appears AGA  Neurological:     Mental Status: She is alert and oriented to person, place, and time.  Psychiatric:        Mood and Affect: Mood normal.        Behavior: Behavior normal.        Thought Content: Thought content normal.   FHR 146  A Medical screening exam complete  Intermittent Headache-None Currently SIUP at 26 weeks   P -Patient informed that due to lack of respiratory distress would not perform confirmatory Covid testing.  -Discussed availability of testing at outpatient centers and  neighborhood pharmacies.  -Patient with mild tachycardia, but this is c/w previous vitals as initial ob HR was 101. -Discussed treatment of HA with tylenol dosing as needed. -Also discussed increase water intake. -Reviewed possibility of eye exam for correlation with tv viewing and HA.  -Patient agreeable to tylenol treatment now and requests discharge. -Encouraged to return for any pregnancy related concerns. -Further encouraged to contact office if HA symptoms not improved with tylenol dosing for prescription medications.  -Discharge from MAU in stable condition   07/08/2020, CNM 11/10/2020 9:15 PM

## 2020-11-10 NOTE — Discharge Instructions (Signed)
General Headache Without Cause A headache is pain or discomfort felt around the head or neck area. The specific cause of a headache may not be found. There are many causes and types of headaches. A few common ones are:  Tension headaches.  Migraine headaches.  Cluster headaches.  Chronic daily headaches. Follow these instructions at home: Watch your condition for any changes. Let your health care provider know about them. Take these steps to help with your condition: Managing pain      Take over-the-counter and prescription medicines only as told by your health care provider.  Lie down in a dark, quiet room when you have a headache.  If directed, put ice on your head and neck area: ? Put ice in a plastic bag. ? Place a towel between your skin and the bag. ? Leave the ice on for 20 minutes, 2-3 times per day.  If directed, apply heat to the affected area. Use the heat source that your health care provider recommends, such as a moist heat pack or a heating pad. ? Place a towel between your skin and the heat source. ? Leave the heat on for 20-30 minutes. ? Remove the heat if your skin turns bright red. This is especially important if you are unable to feel pain, heat, or cold. You may have a greater risk of getting burned.  Keep lights dim if bright lights bother you or make your headaches worse. Eating and drinking  Eat meals on a regular schedule.  If you drink alcohol: ? Limit how much you use to:  0-1 drink a day for women.  0-2 drinks a day for men. ? Be aware of how much alcohol is in your drink. In the [place] one drink equals one 12 oz bottle of beer (355 mL), one 5 oz glass of wine (148 mL), or one 1 oz glass of hard liquor (44 mL).  Stop drinking caffeine, or decrease the amount of caffeine you drink. General instructions   Keep a headache journal to help find out what may trigger your headaches. For example, write down: ? What you eat and drink. ? How much  sleep you get. ? Any change to your diet or medicines.  Try massage or other relaxation techniques.  Limit stress.  Sit up straight, and do not tense your muscles.  Do not use any products that contain nicotine or tobacco, such as cigarettes, e-cigarettes, and chewing tobacco. If you need help quitting, ask your health care provider.  Exercise regularly as told by your health care provider.  Sleep on a regular schedule. Get 7-9 hours of sleep each night, or the amount recommended by your health care provider.  Keep all follow-up visits as told by your health care provider. This is important. Contact a health care provider if:  Your symptoms are not helped by medicine.  You have a headache that is different from the usual headache.  You have nausea or you vomit.  You have a fever. Get help right away if:  Your headache becomes severe quickly.  Your headache gets worse after moderate to intense physical activity.  You have repeated vomiting.  You have a stiff neck.  You have a loss of vision.  You have problems with speech.  You have pain in the eye or ear.  You have muscular weakness or loss of muscle control.  You lose your balance or have trouble walking.  You feel faint or pass out.  You have confusion.    You have a seizure. Summary  A headache is pain or discomfort felt around the head or neck area.  There are many causes and types of headaches. In some cases, the cause may not be found.  Keep a headache journal to help find out what may trigger your headaches. Watch your condition for any changes. Let your health care provider know about them.  Contact a health care provider if you have a headache that is different from the usual headache, or if your symptoms are not helped by medicine.  Get help right away if your headache becomes severe, you vomit, you have a loss of vision, you lose your balance, or you have a seizure. This information is not  intended to replace advice given to you by your health care provider. Make sure you discuss any questions you have with your health care provider. Document Revised: 05/11/2018 Document Reviewed: 05/11/2018 Elsevier Patient Education  2020 Elsevier Inc.  

## 2020-11-14 ENCOUNTER — Inpatient Hospital Stay (HOSPITAL_COMMUNITY)
Admission: AD | Admit: 2020-11-14 | Discharge: 2020-11-15 | Disposition: A | Discharge status: Home / Self Care | Payer: Medicaid Other | Attending: Obstetrics and Gynecology | Admitting: Obstetrics and Gynecology

## 2020-11-14 ENCOUNTER — Other Ambulatory Visit: Payer: Self-pay

## 2020-11-14 DIAGNOSIS — O99612 Diseases of the digestive system complicating pregnancy, second trimester: Secondary | ICD-10-CM | POA: Insufficient documentation

## 2020-11-14 DIAGNOSIS — U071 COVID-19: Secondary | ICD-10-CM | POA: Insufficient documentation

## 2020-11-14 DIAGNOSIS — Z3A27 27 weeks gestation of pregnancy: Secondary | ICD-10-CM | POA: Insufficient documentation

## 2020-11-14 DIAGNOSIS — O98512 Other viral diseases complicating pregnancy, second trimester: Secondary | ICD-10-CM | POA: Insufficient documentation

## 2020-11-14 DIAGNOSIS — R12 Heartburn: Secondary | ICD-10-CM | POA: Insufficient documentation

## 2020-11-14 DIAGNOSIS — O26892 Other specified pregnancy related conditions, second trimester: Secondary | ICD-10-CM

## 2020-11-14 DIAGNOSIS — O26899 Other specified pregnancy related conditions, unspecified trimester: Secondary | ICD-10-CM

## 2020-11-14 DIAGNOSIS — R519 Headache, unspecified: Secondary | ICD-10-CM | POA: Insufficient documentation

## 2020-11-15 ENCOUNTER — Encounter (HOSPITAL_COMMUNITY): Payer: Self-pay | Admitting: Obstetrics and Gynecology

## 2020-11-15 ENCOUNTER — Inpatient Hospital Stay (HOSPITAL_COMMUNITY): Payer: Medicaid Other

## 2020-11-15 DIAGNOSIS — O99612 Diseases of the digestive system complicating pregnancy, second trimester: Secondary | ICD-10-CM | POA: Diagnosis not present

## 2020-11-15 DIAGNOSIS — U071 COVID-19: Secondary | ICD-10-CM | POA: Diagnosis not present

## 2020-11-15 DIAGNOSIS — Z3A27 27 weeks gestation of pregnancy: Secondary | ICD-10-CM

## 2020-11-15 DIAGNOSIS — R12 Heartburn: Secondary | ICD-10-CM | POA: Diagnosis not present

## 2020-11-15 DIAGNOSIS — O98512 Other viral diseases complicating pregnancy, second trimester: Secondary | ICD-10-CM | POA: Diagnosis not present

## 2020-11-15 DIAGNOSIS — R519 Headache, unspecified: Secondary | ICD-10-CM

## 2020-11-15 DIAGNOSIS — R0602 Shortness of breath: Secondary | ICD-10-CM | POA: Diagnosis present

## 2020-11-15 DIAGNOSIS — O99891 Other specified diseases and conditions complicating pregnancy: Secondary | ICD-10-CM

## 2020-11-15 LAB — URINALYSIS, ROUTINE W REFLEX MICROSCOPIC
Bilirubin Urine: NEGATIVE
Glucose, UA: NEGATIVE mg/dL
Hgb urine dipstick: NEGATIVE
Ketones, ur: NEGATIVE mg/dL
Leukocytes,Ua: NEGATIVE
Nitrite: NEGATIVE
Protein, ur: NEGATIVE mg/dL
Specific Gravity, Urine: 1.014 (ref 1.005–1.030)
pH: 6 (ref 5.0–8.0)

## 2020-11-15 LAB — CBC WITH DIFFERENTIAL/PLATELET
Abs Immature Granulocytes: 0.25 10*3/uL — ABNORMAL HIGH (ref 0.00–0.07)
Basophils Absolute: 0 10*3/uL (ref 0.0–0.1)
Basophils Relative: 0 %
Eosinophils Absolute: 0.1 10*3/uL (ref 0.0–0.5)
Eosinophils Relative: 1 %
HCT: 31.1 % — ABNORMAL LOW (ref 36.0–46.0)
Hemoglobin: 10.1 g/dL — ABNORMAL LOW (ref 12.0–15.0)
Immature Granulocytes: 3 %
Lymphocytes Relative: 9 %
Lymphs Abs: 0.7 10*3/uL (ref 0.7–4.0)
MCH: 27.7 pg (ref 26.0–34.0)
MCHC: 32.5 g/dL (ref 30.0–36.0)
MCV: 85.4 fL (ref 80.0–100.0)
Monocytes Absolute: 0.7 10*3/uL (ref 0.1–1.0)
Monocytes Relative: 9 %
Neutro Abs: 5.7 10*3/uL (ref 1.7–7.7)
Neutrophils Relative %: 78 %
Platelets: 236 10*3/uL (ref 150–400)
RBC: 3.64 MIL/uL — ABNORMAL LOW (ref 3.87–5.11)
RDW: 14 % (ref 11.5–15.5)
WBC: 7.4 10*3/uL (ref 4.0–10.5)
nRBC: 0 % (ref 0.0–0.2)

## 2020-11-15 LAB — COMPREHENSIVE METABOLIC PANEL
ALT: 33 U/L (ref 0–44)
AST: 43 U/L — ABNORMAL HIGH (ref 15–41)
Albumin: 2.8 g/dL — ABNORMAL LOW (ref 3.5–5.0)
Alkaline Phosphatase: 44 U/L (ref 38–126)
Anion gap: 9 (ref 5–15)
BUN: <5 mg/dL — ABNORMAL LOW (ref 6–20)
CO2: 21 mmol/L — ABNORMAL LOW (ref 22–32)
Calcium: 8.5 mg/dL — ABNORMAL LOW (ref 8.9–10.3)
Chloride: 105 mmol/L (ref 98–111)
Creatinine, Ser: 0.59 mg/dL (ref 0.44–1.00)
GFR, Estimated: >60 mL/min (ref >60)
Glucose, Bld: 90 mg/dL (ref 70–99)
Potassium: 3.7 mmol/L (ref 3.5–5.1)
Sodium: 135 mmol/L (ref 135–145)
Total Bilirubin: 0.4 mg/dL (ref 0.3–1.2)
Total Protein: 6.2 g/dL — ABNORMAL LOW (ref 6.5–8.1)

## 2020-11-15 LAB — RESP PANEL BY RT-PCR (FLU A&B, COVID) ARPGX2
Influenza A by PCR: NEGATIVE
Influenza B by PCR: NEGATIVE
SARS Coronavirus 2 by RT PCR: POSITIVE — AB

## 2020-11-15 LAB — TROPONIN I (HIGH SENSITIVITY): Troponin I (High Sensitivity): 3 ng/L (ref <18)

## 2020-11-15 LAB — BRAIN NATRIURETIC PEPTIDE: B Natriuretic Peptide: 23.3 pg/mL (ref 0.0–100.0)

## 2020-11-15 MED ORDER — ALUM & MAG HYDROXIDE-SIMETH 200-200-20 MG/5ML PO SUSP
30.0000 mL | Freq: Once | ORAL | Status: AC
  Administered 2020-11-15: 30 mL via ORAL
  Filled 2020-11-15: qty 30

## 2020-11-15 MED ORDER — GUAIFENESIN ER 600 MG PO TB12: 600.0000 mg | ORAL_TABLET | Freq: Two times a day (BID) | ORAL | 11 refills | Status: DC

## 2020-11-15 MED ORDER — BUTALBITAL-APAP-CAFFEINE 50-325-40 MG PO TABS
2.0000 | ORAL_TABLET | Freq: Once | ORAL | Status: AC
  Administered 2020-11-15: 2 via ORAL
  Filled 2020-11-15: qty 2

## 2020-11-15 MED ORDER — PANTOPRAZOLE SODIUM 20 MG PO TBEC: 20.0000 mg | DELAYED_RELEASE_TABLET | Freq: Every day | ORAL | 0 refills | Status: DC

## 2020-11-15 MED ORDER — LIDOCAINE VISCOUS HCL 2 % MT SOLN
15.0000 mL | Freq: Once | OROMUCOSAL | Status: AC
  Administered 2020-11-15: 15 mL via ORAL
  Filled 2020-11-15: qty 15

## 2020-11-15 NOTE — MAU Note (Signed)
..  Alanie Syler is a 20 y.o. at [redacted]w[redacted]d here in MAU reporting: Shortness of breath, dry cough, heart burn, headache, diarrhea, and runny nose. Patient lives with her sister who tested positive on new years. Patients symptoms began 5 days after new years. Shortness of breath began yesterday 11/13/2020. Patient states her heart burn makes her chest hurt. Denies vaginal bleeding, leaking of fluid, contractions. +FM Pain score: 8/10 headache; 9/10 heartburn Vitals:   11/15/20 0017  BP: 114/65  Pulse: (!) 128  Resp: 20  Temp: 99.2 F (37.3 C)  SpO2: 98%     FHT: monitors applied 130 Lab orders placed from triage: Attempted to collect UA, patient urinated before receiving cup. Will collect when able.

## 2020-11-15 NOTE — MAU Provider Note (Signed)
Chief Complaint:  Shortness of Breath   Event Date/Time   First Provider Initiated Contact with Patient 11/15/20 0102     HPI: Kari Dunn is a 19 y.o. G1P0 at 27w3dwho presents to maternity admissions reporting shortness of breath, cough, diarrhea, and headache. Has been having symptoms since January 6, though when she presented here on 11/10/20, she complained only of headache.   She reports good fetal movement, denies LOF, vaginal bleeding, vaginal itching/burning, urinary symptoms, dizziness, n/v, constipation.    Shortness of Breath The current episode started in the past 7 days. The problem occurs constantly. Associated symptoms include a fever and rhinorrhea. Pertinent negatives include no abdominal pain, vomiting or wheezing. She has tried nothing for the symptoms.    RN Note Kari Dunn is a 19 y.o. at [redacted]w[redacted]d here in MAU reporting: Shortness of breath, dry cough, heart burn, headache, diarrhea, and runny nose. Patient lives with her sister who tested positive on new years. Patients symptoms began 5 days after new years. Shortness of breath began yesterday 11/13/2020. Patient states her heart burn makes her chest hurt. Denies vaginal bleeding, leaking of fluid, contractions. +FM Pain score: 8/10 headache; 9/10 heartburn  Past Medical History: Past Medical History:  Diagnosis Date  . Anemia     Past obstetric history: OB History  Gravida Para Term Preterm AB Living  1         0  SAB IAB Ectopic Multiple Live Births               # Outcome Date GA Lbr Len/2nd Weight Sex Delivery Anes PTL Lv  1 Current             Past Surgical History: Past Surgical History:  Procedure Laterality Date  . broken arm  2013  . NO PAST SURGERIES      Family History: Family History  Problem Relation Age of Onset  . Hypertension Mother     Social History: Social History   Tobacco Use  . Smoking status: Never Smoker  . Smokeless tobacco: Never Used  Vaping Use  . Vaping Use:  Former  Substance Use Topics  . Alcohol use: Not Currently  . Drug use: Not Currently    Types: Marijuana    Comment: stopped when she found out about pregnancy    Allergies: No Known Allergies  Meds:  Medications Prior to Admission  Medication Sig Dispense Refill Last Dose  . docusate sodium (COLACE) 100 MG capsule Take 100 mg by mouth 2 (two) times daily.   Past Month at Unknown time  . ferrous sulfate 325 (65 FE) MG tablet Take 1 tablet (325 mg total) by mouth 2 (two) times daily with a meal. 60 tablet 5 11/14/2020 at Unknown time  . Prenat-Fe Poly-Methfol-FA-DHA (VITAFOL ULTRA) 29-0.6-0.4-200 MG CAPS Take 1 tablet by mouth daily. 30 capsule 12 11/14/2020 at Unknown time  . Blood Pressure Monitoring (BLOOD PRESSURE KIT) DEVI 1 kit by Does not apply route as needed. 1 each 0   . omeprazole (PRILOSEC) 20 MG capsule Take 1 capsule (20 mg total) by mouth daily. 30 capsule 1     I have reviewed patient's Past Medical Hx, Surgical Hx, Family Hx, Social Hx, medications and allergies.   ROS:  Review of Systems  Constitutional: Positive for fever.  HENT: Positive for rhinorrhea.   Respiratory: Positive for shortness of breath. Negative for wheezing.   Gastrointestinal: Negative for abdominal pain and vomiting.   Other systems negative  Physical Exam     Patient Vitals for the past 24 hrs:  BP Temp Temp src Pulse Resp SpO2 Height Weight  11/15/20 0017 114/65 99.2 F (37.3 C) Oral (!) 128 20 98 % 5' 7" (1.702 m) 90 kg   Constitutional: Well-developed, well-nourished female in no acute distress, states feels short of breath.  Cardiovascular: normal rate and rhythm Respiratory: normal effort, clear to auscultation bilaterally GI: Abd soft, non-tender, gravid appropriate for gestational age.   No rebound or guarding. MS: Extremities nontender, no edema, normal ROM Neurologic: Alert and oriented x 4.  GU: Neg CVAT.  PELVIC EXAM: deferred FHT:  Baseline 140 , moderate variability,  accelerations present, no decelerations Contractions: Occasional   Labs: Results for orders placed or performed during the hospital encounter of 11/14/20 (from the past 24 hour(s))  Resp Panel by RT-PCR (Flu A&B, Covid) Nasopharyngeal Swab     Status: Abnormal   Collection Time: 11/15/20 12:56 AM   Specimen: Nasopharyngeal Swab; Nasopharyngeal(NP) swabs in vial transport medium  Result Value Ref Range   SARS Coronavirus 2 by RT PCR POSITIVE (A) NEGATIVE   Influenza A by PCR NEGATIVE NEGATIVE   Influenza B by PCR NEGATIVE NEGATIVE  CBC with Differential/Platelet     Status: Abnormal   Collection Time: 11/15/20  1:48 AM  Result Value Ref Range   WBC 7.4 4.0 - 10.5 K/uL   RBC 3.64 (L) 3.87 - 5.11 MIL/uL   Hemoglobin 10.1 (L) 12.0 - 15.0 g/dL   HCT 31.1 (L) 36.0 - 46.0 %   MCV 85.4 80.0 - 100.0 fL   MCH 27.7 26.0 - 34.0 pg   MCHC 32.5 30.0 - 36.0 g/dL   RDW 14.0 11.5 - 15.5 %   Platelets 236 150 - 400 K/uL   nRBC 0.0 0.0 - 0.2 %   Neutrophils Relative % 78 %   Neutro Abs 5.7 1.7 - 7.7 K/uL   Lymphocytes Relative 9 %   Lymphs Abs 0.7 0.7 - 4.0 K/uL   Monocytes Relative 9 %   Monocytes Absolute 0.7 0.1 - 1.0 K/uL   Eosinophils Relative 1 %   Eosinophils Absolute 0.1 0.0 - 0.5 K/uL   Basophils Relative 0 %   Basophils Absolute 0.0 0.0 - 0.1 K/uL   Immature Granulocytes 3 %   Abs Immature Granulocytes 0.25 (H) 0.00 - 0.07 K/uL  Brain natriuretic peptide     Status: None   Collection Time: 11/15/20  1:48 AM  Result Value Ref Range   B Natriuretic Peptide 23.3 0.0 - 100.0 pg/mL  Comprehensive metabolic panel     Status: Abnormal   Collection Time: 11/15/20  1:48 AM  Result Value Ref Range   Sodium 135 135 - 145 mmol/L   Potassium 3.7 3.5 - 5.1 mmol/L   Chloride 105 98 - 111 mmol/L   CO2 21 (L) 22 - 32 mmol/L   Glucose, Bld 90 70 - 99 mg/dL   BUN <5 (L) 6 - 20 mg/dL   Creatinine, Ser 0.59 0.44 - 1.00 mg/dL   Calcium 8.5 (L) 8.9 - 10.3 mg/dL   Total Protein 6.2 (L) 6.5 - 8.1  g/dL   Albumin 2.8 (L) 3.5 - 5.0 g/dL   AST 43 (H) 15 - 41 U/L   ALT 33 0 - 44 U/L   Alkaline Phosphatase 44 38 - 126 U/L   Total Bilirubin 0.4 0.3 - 1.2 mg/dL   GFR, Estimated >60 >60 mL/min   Anion gap 9 5 - 15  Troponin I (High Sensitivity)       Status: None   Collection Time: 11/15/20  1:48 AM  Result Value Ref Range   Troponin I (High Sensitivity) 3 <18 ng/L  Urinalysis, Routine w reflex microscopic Urine, Clean Catch     Status: None   Collection Time: 11/15/20  2:11 AM  Result Value Ref Range   Color, Urine YELLOW YELLOW   APPearance CLEAR CLEAR   Specific Gravity, Urine 1.014 1.005 - 1.030   pH 6.0 5.0 - 8.0   Glucose, UA NEGATIVE NEGATIVE mg/dL   Hgb urine dipstick NEGATIVE NEGATIVE   Bilirubin Urine NEGATIVE NEGATIVE   Ketones, ur NEGATIVE NEGATIVE mg/dL   Protein, ur NEGATIVE NEGATIVE mg/dL   Nitrite NEGATIVE NEGATIVE   Leukocytes,Ua NEGATIVE NEGATIVE    O/Positive/-- (11/11 1153)  Imaging:  DG Chest Portable 1 View  Result Date: 11/15/2020 CLINICAL DATA:  Shortness of breath, cough EXAM: PORTABLE CHEST 1 VIEW COMPARISON:  None. FINDINGS: The heart size and mediastinal contours are within normal limits. Both lungs are clear. The visualized skeletal structures are unremarkable. IMPRESSION: Negative Electronically Signed   By: Rolm Baptise M.D.   On: 11/15/2020 01:31      MAU Course/MDM: I have ordered labs and reviewed results. These are normal   Chest Xray is negative/clear.  NST reviewed, reassuring for gestational age.  Treatments in MAU included Fioricet for headache and GI cocktail   Will change from Prilosec to Protonix for home use.  Discussed supportive care, Mucinex, Tylenol, stay upright, deep breathing exercises. WIll refer to Covid treatment team.    Assessment: Single IUP at 41w3dCovid  Headache Heartburn  Plan: Discharge home Rx Mucinex for cough Rx Protonix for heartburn Preterm Labor precautions and fetal kick counts Follow up in Office  for prenatal visits (virtual for now) Referred to Covid care team  Encouraged to return if she develops worsening of symptoms, increase in pain, fever, or other concerning symptoms.   Pt stable at time of discharge.  MHansel FeinsteinCNM, MSN Certified Nurse-Midwife 11/15/2020 1:02 AM

## 2020-11-15 NOTE — Discharge Instructions (Signed)
10 Things You Can Do to Manage Your COVID-19 Symptoms at Home If you have possible or confirmed COVID-19: 1. Stay home except to get medical care. 2. Monitor your symptoms carefully. If your symptoms get worse, call your healthcare provider immediately. 3. Get rest and stay hydrated. 4. If you have a medical appointment, call the healthcare provider ahead of time and tell them that you have or may have COVID-19. 5. For medical emergencies, call 911 and notify the dispatch personnel that you have or may have COVID-19. 6. Cover your cough and sneezes with a tissue or use the inside of your elbow. 7. Wash your hands often with soap and water for at least 20 seconds or clean your hands with an alcohol-based hand sanitizer that contains at least 60% alcohol. 8. As much as possible, stay in a specific room and away from other people in your home. Also, you should use a separate bathroom, if available. If you need to be around other people in or outside of the home, wear a mask. 9. Avoid sharing personal items with other people in your household, like dishes, towels, and bedding. 10. Clean all surfaces that are touched often, like counters, tabletops, and doorknobs. Use household cleaning sprays or wipes according to the label instructions. [email or web address] 05/19/2020 This information is not intended to replace advice given to you by your health care provider. Make sure you discuss any questions you have with your health care provider. Document Revised: 09/04/2020 Document Reviewed: 09/04/2020 Elsevier Patient Education  2021 [place]: What to Do if You Are Sick If you have a fever, cough or other symptoms, you might have COVID-19. Most people have mild illness and are able to recover at home. If you are sick:  Keep track of your symptoms.  If you have an emergency warning sign (including trouble breathing), call 911. Steps to help prevent the spread of COVID-19 if you are  sick If you are sick with COVID-19 or think you might have COVID-19, follow the steps below to care for yourself and to help protect other people in your home and community. Stay home except to get medical care  Stay home. Most people with COVID-19 have mild illness and can recover at home without medical care. Do not leave your home, except to get medical care. Do not visit public areas.  Take care of yourself. Get rest and stay hydrated. Take over-the-counter medicines, such as acetaminophen, to help you feel better.  Stay in touch with your doctor. Call before you get medical care. Be sure to get care if you have trouble breathing, or have any other emergency warning signs, or if you think it is an emergency.  Avoid public transportation, ride-sharing, or taxis. Separate yourself from other people As much as possible, stay in a specific room and away from other people and pets in your home. If possible, you should use a separate bathroom. If you need to be around other people or animals in or outside of the home, wear a mask. Tell your close contactsthat they may have been exposed to COVID-19. An infected person can spread COVID-19 starting 48 hours (or 2 days) before the person has any symptoms or tests positive. By letting your close contacts know they may have been exposed to COVID-19, you are helping to protect everyone.  Additional guidance is available for those living in close quarters and shared housing.  See COVID-19 and Animals if you have questions about pets.  If you  are diagnosed with COVID-19, someone from the health department may call you. Answer the call to slow the spread. Monitor your symptoms  Symptoms of COVID-19 include fever, cough, or other symptoms.  Follow care instructions from your healthcare provider and local health department. Your local health authorities may give instructions on checking your symptoms and reporting information. When to seek emergency  medical attention Look for emergency warning signs* for COVID-19. If someone is showing any of these signs, seek emergency medical care immediately:  Trouble breathing  Persistent pain or pressure in the chest  New confusion  Inability to wake or stay awake  Pale, gray, or blue-colored skin, lips, or nail beds, depending on skin tone *This list is not all possible symptoms. Please call your medical provider for any other symptoms that are severe or concerning to you. Call 911 or call ahead to your local emergency facility: Notify the operator that you are seeking care for someone who has or may have COVID-19. Call ahead before visiting your doctor  Call ahead. Many medical visits for routine care are being postponed or done by phone or telemedicine.  If you have a medical appointment that cannot be postponed, call your doctor's office, and tell them you have or may have COVID-19. This will help the office protect themselves and other patients. Get  tested  If you have symptoms of COVID-19, get tested. While waiting for test results, you stay away from others, including staying apart from those living in your household.  You can visit your state, tribal, local, and territorialhealth department's website to look for the latest local information on testing sites. If you are sick, wear a mask over your nose and mouth  You should wear a mask over your nose and mouth if you must be around other people or animals, including pets (even at home).  You don't need to wear the mask if you are alone. If you can't put on a mask (because of trouble breathing, for example), cover your coughs and sneezes in some other way. Try to stay at least 6 feet away from other people. This will help protect the people around you.  Masks should not be placed on young children under age 48 years, anyone who has trouble breathing, or anyone who is not able to remove the mask without help. Note: During the COVID-19  pandemic, medical grade facemasks are reserved for healthcare workers and some first responders. Cover your coughs and sneezes  Cover your mouth and nose with a tissue when you cough or sneeze.  Throw away used tissues in a lined trash can.  Immediately wash your hands with soap and water for at least 20 seconds. If soap and water are not available, clean your hands with an alcohol-based hand sanitizer that contains at least 60% alcohol. Clean your hands often  Wash your hands often with soap and water for at least 20 seconds. This is especially important after blowing your nose, coughing, or sneezing; going to the bathroom; and before eating or preparing food.  Use hand sanitizer if soap and water are not available. Use an alcohol-based hand sanitizer with at least 60% alcohol, covering all surfaces of your hands and rubbing them together until they feel dry.  Soap and water are the best option, especially if hands are visibly dirty.  Avoid touching your eyes, nose, and mouth with unwashed hands.  Handwashing Tips Avoid sharing personal household items  Do not share dishes, drinking glasses, cups, eating utensils, towels,  or bedding with other people in your home.  Wash these items thoroughly after using them with soap and water or put in the dishwasher. Clean all "high-touch" surfaces everyday  Clean and disinfect high-touch surfaces in your "sick room" and bathroom; wear disposable gloves. Let someone else clean and disinfect surfaces in common areas, but you should clean your bedroom and bathroom, if possible.  If a caregiver or other person needs to clean and disinfect a sick person's bedroom or bathroom, they should do so on an as-needed basis. The caregiver/other person should wear a mask and disposable gloves prior to cleaning. They should wait as long as possible after the person who is sick has used the bathroom before coming in to clean and use the bathroom. ? High-touch  surfaces include phones, remote controls, counters, tabletops, doorknobs, bathroom fixtures, toilets, keyboards, tablets, and bedside tables.  Clean and disinfect areas that may have blood, stool, or body fluids on them.  Use household cleaners and disinfectants. Clean the area or item with soap and water or another detergent if it is dirty. Then, use a household disinfectant. ? Be sure to follow the instructions on the label to ensure safe and effective use of the product. Many products recommend keeping the surface wet for several minutes to ensure germs are killed. Many also recommend precautions such as wearing gloves and making sure you have good ventilation during use of the product. ? Use a product from [employer] List N: Disinfectants for Coronavirus (COVID-19). ? Complete Disinfection Guidance When you can be around others after being sick with COVID-19 Deciding when you can be around others is different for different situations. Find out when you can safely end home isolation. For any additional questions about your care, contact your healthcare provider or state or local health department. 01/19/2020 Content source: [facility] for Immunization and Respiratory Diseases ([facility]), Division of Viral Diseases This information is not intended to replace advice given to you by your health care provider. Make sure you discuss any questions you have with your health care provider. Document Revised: 09/04/2020 Document Reviewed: 09/04/2020 Elsevier Patient Education  2021 Elsevier Inc.  Pregnancy and COVID-19 Pregnant women and women who were recently pregnant are at an increased risk for severe illness from COVID-19. Other conditions, such as being pregnant at an older age or having diabetes or obesity, can further increase the risk of severe illness from COVID-19. This risk can last for at least 42 days following the end of the pregnancy. Protect yourself and your baby by:  Knowing your risk  factors. Ask your health care provider about your specific risk factors.  Working with your health care team to protect yourself against all infections, including COVID-19. How does COVID-19 affect me? If you get COVID-19 while pregnant or shortly after your pregnancy, there is an increased risk that you may:  Get a respiratory illness that can lead to pneumonia or severe illness.  Give birth to your baby before 37 weeks of pregnancy (preterm birth).  Have other complications that can affect your pregnancy. How does COVID-19 affect my care? If you have COVID-19, special precautions will be taken around your pregnancy:  You will have to notify the clinic or hospital before a visit. Steps will be taken to protect other people from the virus, including seeing you in a special room.  Tests and scans may be done differently before delivery (prenatal care).  Your birth plan may change, including what room you will be in and  who may be with you during labor and delivery.  You may stay longer in the hospital after delivery (postpartum care).  COVID-19 will affect where your baby will stay after delivery. Ask about the risks and benefits of staying in the same room with your baby. Benefits include breastfeeding and mother-newborn bonding.  You may have to feed your baby differently.  Visitors will be limited after your baby is born. How does COVID-19 affect my baby? It is very rare for a mother with COVID-19 to pass the virus to the unborn baby. After birth, a baby can get the virus if he or she is exposed to it. Ask your health care provider about ways to protect your baby. The baby can be placed in an incubator. A physical barrier can also be used. What can I do to lower my risk? Medicines and vaccines  You can receive a COVID-19 vaccination. This can protect you from severe illness. If you have concerns, talk to your health care provider.  Get other recommended vaccines, including the  flu vaccine and the whooping cough (Tdap) vaccine.  Ask your health care provider if you can get a 30-day, or longer, supply of your medicines, so you can make fewer trips to the pharmacy.  If you have received a COVID-19 vaccine, consider enrolling in the v-safe program from the CDC. This program uses an app on your smartphone to provide check-ins and gather information on your health after you receive the vaccine. There is a separate registry for pregnant women. For more information, visit: ? V-safe tool: https://www.patterson-winters.biz/ ? V-safe pregnancy registry: https://gomez-solis.com/ Cleaning and personal hygiene If you are in isolation for COVID-19 and are sharing a room with your newborn, take these steps to reduce the risk of spreading the virus to your newborn:  Wash your hands with soap and water for at least 20 seconds before holding or caring for your baby. If soap and water are not available, use alcohol-based hand sanitizer.  Wear a mask when within 6 feet (1.8 m) of your baby.  Keep your baby more than 6 feet (1.8 m) away from you as much as possible.  Avoid touching your mouth, face, eyes, or nose before washing your hands.  Clean and disinfect objects and surfaces that are frequently touched.   Other things to do  Avoid people who might have been exposed to or infected with COVID-19, including people who live with you.  Cover your mouth and nose by wearing a mask or other cloth covering over your face when you go out in public.  Avoid people who are not wearing a mask.  Avoid large crowds. Maintain at least 6 feet (1.8 m) between yourself and others.  Avoid poorly ventilated spaces.  Call your health care provider if you have any health concerns. ? Contact your health care provider right away if you think you have COVID-19. Tell your health care provider that you think you may have a COVID-19 infection and that you are pregnant. Breastfeeding tips Plan with your  family and health care team how to feed your baby. Current research shows that the virus may not pass to a baby through breast milk. If you are breastfeeding, you can receive a COVID-19 vaccine. The vaccines pose no risk for breastfeeding mothers or their babies.  Some of the vaccines might create antibodies in breast milk. These antibodies can help to protect your baby. Take precautions if you have or may have COVID-19. Precautions include:  Washing hands  with soap and water for at least 20 seconds before feeding your baby. If soap and water are not available, use alcohol-based hand sanitizer.  Wearing a mask while feeding your baby.  Pumping or expressing breast milk to feed to your baby. If possible, ask someone in your household who is not sick to feed your baby the expressed breast milk. ? Wash your hands with soap and water for at least 20 seconds before touching pump parts. ? Wash and disinfect all pump parts after expressing milk. Follow the manufacturer's instructions to clean and disinfect all pump parts. Follow these instructions: Managing stress Some pregnant and postpartum women may have fear, uncertainty, and stress because of COVID-19. Find ways to manage stress. These may include:  Using relaxation techniques such as meditation and deep breathing.  Getting regular exercise. Most women can continue their usual exercise routine during pregnancy. Ask your health care provider what activities are safe for you.  Seeking support from family, friends, or spiritual resources. If you cannot be together in person, you can still connect by phone calls, texts, video calls, or online messaging.  Doing relaxing activities that you enjoy, such as listening to music or reading a good book. General instructions  Follow your health care provider's instructions on taking medicines. Some medicines may not be safe to take during pregnancy.  Ask for help if you have counseling or nutritional  needs. Your health care provider can offer advice or refer you to resources or specialists who can help you with various needs.  Keep all follow-up visits. This is important. This includes visits before and after you have your baby. Questions to ask your health care team  What should I do if I have COVID-19 symptoms?  What are the side effects that can occur after receiving any of the available COVID-19 vaccines?  How will COVID-19 affect my prenatal care visits, tests and scans, labor and delivery, and postpartum care?  What are the risks of COVID-19 to me and the potential risks to my unborn baby or infant?  How do vaccines pass antibodies to my unborn baby?  Should I plan to breastfeed my baby?  Where can I find mental health resources?  Where can I find support if I have financial concerns? Where to find more information  CDC: KVTVnet.com.cy  World Health Organization [facility]): CommodityPost.es  [employer] of [employer]necologists (ACOG): www.acog.org Contact a health care provider if:  You have signs and symptoms of infection, including a fever or cough. ? Tell your health care team that you think you may have a COVID-19 infection and that you are pregnant.  You have strong emotions, such as sadness or anxiety.  You feel unsafe in your home and need help finding a safe place to live.  You have bloody or watery vaginal discharge or vaginal bleeding. Get help right away if:  You have signs or symptoms of labor before 37 weeks of pregnancy. These include: ? Contractions that are 5 minutes or less apart, or that increase in frequency, intensity, or length. ? Sudden, sharp pain in the abdomen or in the lower back. ? A gush or trickle of fluid from your vagina.  You have signs of more serious illness, such as: ? Trouble breathing. ? Chest pain. ? A fever of 102.48F (39C) or higher that does not go away. ? Vomiting  every time you drink fluids. ? Feeling extremely weak. ? Fainting. These symptoms may represent a serious problem that is an  emergency. Do not wait to see if the symptoms will go away. Get medical help right away. Call your local emergency services (911 in the [place] Do not drive yourself to the hospital. Summary  Pregnant women and women who were recently pregnant are at an increased risk for severe illness from COVID-19.  Take precautions to protect yourself and your baby. Wear a mask. Wash hands often. Avoid touching your mouth, face, eyes, or nose before washing hands. Avoid large groups of people and stay away from people who are sick.  If you think you have a COVID-19 infection, contact your health care provider right away. Tell your health care provider that you think you have COVID-19 and that you are pregnant.  If you have COVID-19, special precautions may be taken during pregnancy, labor and delivery, and after delivery. This information is not intended to replace advice given to you by your health care provider. Make sure you discuss any questions you have with your health care provider. Document Revised: 09/04/2020 Document Reviewed: 09/04/2020 Elsevier Patient Education  2021 [employer]

## 2020-11-16 ENCOUNTER — Encounter (INDEPENDENT_AMBULATORY_CARE_PROVIDER_SITE_OTHER): Payer: Self-pay

## 2020-11-16 ENCOUNTER — Telehealth: Payer: Self-pay

## 2020-11-16 NOTE — Telephone Encounter (Signed)
Called patient because of new vomiting symptom reported today on questionnaire.  She states that she has just vomited one time and it was because she was coughing.  She was instructed to continue to rest and drink plenty of fluids.  She was told after stomach settles to start with foods such as crackers pretzels soups and boiled starches.  She was told to seek care if vomiting more than 6 times per day or she  develops symptoms of dehydration, increased thirst, decrease in urine output dark yellow urine severe headache, or dizziness.  She verbalized understanding of all information.

## 2020-11-20 ENCOUNTER — Telehealth: Payer: Self-pay

## 2020-11-20 NOTE — Telephone Encounter (Signed)
Called pt regarding new symptom of vomiting on BPA. LMOM.

## 2020-11-23 ENCOUNTER — Other Ambulatory Visit: Payer: Medicaid Other

## 2020-11-23 ENCOUNTER — Other Ambulatory Visit: Payer: Self-pay

## 2020-11-23 ENCOUNTER — Ambulatory Visit (INDEPENDENT_AMBULATORY_CARE_PROVIDER_SITE_OTHER): Payer: Medicaid Other

## 2020-11-23 VITALS — BP 118/83 | HR 89 | Wt 194.0 lb

## 2020-11-23 DIAGNOSIS — Z3A28 28 weeks gestation of pregnancy: Secondary | ICD-10-CM

## 2020-11-23 DIAGNOSIS — Z3400 Encounter for supervision of normal first pregnancy, unspecified trimester: Secondary | ICD-10-CM

## 2020-11-23 NOTE — Progress Notes (Signed)
   LOW-RISK PREGNANCY OFFICE VISIT  Patient name: Kari Dunn MRN [id number]  Date of birth: 2001/07/22 Chief Complaint:   Routine Prenatal Visit  Subjective:   Kari Dunn is a 20 y.o. G1P0 female at [redacted]w[redacted]d with an Estimated Date of Delivery: 02/11/21 being seen today for ongoing management of a low-risk pregnancy aeb has Supervision of normal first pregnancy, antepartum and Alpha thalassemia silent carrier on their problem list.  Patient presents today with no complaints.  Patient endorses fetal movement. She denies contractions and vaginal concerns including discharge, bleeding, leaking, odor, itching, or irritation. Contractions: Not present. Vag. Bleeding: None.  Movement: Present.  Reviewed past medical,surgical, social, obstetrical and family history as well as problem list, medications and allergies.  Objective   Vitals:   11/23/20 0828  BP: 118/83  Pulse: 89  Weight: 194 lb (88 kg)  Body mass index is 30.38 kg/m.  Total Weight Gain:30 lb (13.6 kg)         Physical Examination:   General appearance: Well appearing, and in no distress  Mental status: Alert, oriented to person, place, and time  Skin: Warm & dry  Cardiovascular: Normal heart rate noted  Respiratory: Normal respiratory effort, no distress  Abdomen: Soft, gravid, nontender, AGA with Fundal height of Fundal Height: 27 cm  Pelvic: Cervical exam deferred           Extremities: Edema: None  Fetal Status: Fetal Heart Rate (bpm): 144  Movement: Present   No results found for this or any previous visit (from the past 24 hour(s)).  Assessment & Plan:  Low-risk pregnancy of a 20 y.o., G1P0 at [redacted]w[redacted]d with an Estimated Date of Delivery: 02/11/21   1. Supervision of normal first pregnancy, antepartum -Anticipatory guidance for upcoming appts. -Patient to next appt in 2 weeks as an in-person visit. -Reviewed blood draw procedures and labs which also include check of iron level.  -Discussed how results of GTT are  handled including diabetic education and BS testing for abnormal results and routine care for normal results.   2. [redacted] weeks gestation of pregnancy -Doing well. -Briefly reviewed postpartum planning.    Meds: No orders of the defined types were placed in this encounter.  Labs/procedures today:  Lab Orders     Glucose Tolerance, 2 Hours w/1 Hour     HIV antibody (with reflex)     RPR     CBC   Reviewed: Preterm labor symptoms and general obstetric precautions including but not limited to vaginal bleeding, contractions, leaking of fluid and fetal movement were reviewed in detail with the patient.  All questions were answered.  Follow-up: Return in about 2 weeks (around 12/07/2020) for LROB.  Orders Placed This Encounter  Procedures  . Glucose Tolerance, 2 Hours w/1 Hour  . HIV antibody (with reflex)  . RPR  . CBC   Cherre Robins MSN, CNM 11/23/2020

## 2020-11-23 NOTE — Progress Notes (Signed)
ROB/GTT, declined TDAP vaccine. Reports no problems today.

## 2020-11-24 LAB — CBC
Hematocrit: 31.8 % — ABNORMAL LOW (ref 34.0–46.6)
Hemoglobin: 10.5 g/dL — ABNORMAL LOW (ref 11.1–15.9)
MCH: 27.3 pg (ref 26.6–33.0)
MCHC: 33 g/dL (ref 31.5–35.7)
MCV: 83 fL (ref 79–97)
Platelets: 267 10*3/uL (ref 150–450)
RBC: 3.84 x10E6/uL (ref 3.77–5.28)
RDW: 13.4 % (ref 11.7–15.4)
WBC: 7 10*3/uL (ref 3.4–10.8)

## 2020-11-24 LAB — GLUCOSE TOLERANCE, 2 HOURS W/ 1HR
Glucose, 1 hour: 124 mg/dL (ref 65–179)
Glucose, 2 hour: 99 mg/dL (ref 65–152)
Glucose, Fasting: 82 mg/dL (ref 65–91)

## 2020-11-24 LAB — HIV ANTIBODY (ROUTINE TESTING W REFLEX): HIV Screen 4th Generation wRfx: NONREACTIVE

## 2020-11-24 LAB — RPR: RPR Ser Ql: NONREACTIVE

## 2020-12-06 ENCOUNTER — Other Ambulatory Visit: Payer: Self-pay

## 2020-12-06 MED ORDER — BREAST PUMP MISC: 0 refills | Status: DC

## 2020-12-07 ENCOUNTER — Encounter: Payer: Self-pay | Admitting: Family Medicine

## 2020-12-07 ENCOUNTER — Ambulatory Visit (INDEPENDENT_AMBULATORY_CARE_PROVIDER_SITE_OTHER): Payer: Medicaid Other | Admitting: Family Medicine

## 2020-12-07 ENCOUNTER — Ambulatory Visit: Payer: Medicaid Other | Attending: Obstetrics and Gynecology

## 2020-12-07 ENCOUNTER — Ambulatory Visit: Payer: Medicaid Other | Admitting: *Deleted

## 2020-12-07 ENCOUNTER — Other Ambulatory Visit: Payer: Self-pay

## 2020-12-07 ENCOUNTER — Encounter: Payer: Self-pay | Admitting: *Deleted

## 2020-12-07 VITALS — BP 118/82 | HR 94 | Wt 205.0 lb

## 2020-12-07 VITALS — BP 114/71 | HR 98

## 2020-12-07 DIAGNOSIS — Z3400 Encounter for supervision of normal first pregnancy, unspecified trimester: Secondary | ICD-10-CM

## 2020-12-07 DIAGNOSIS — D649 Anemia, unspecified: Secondary | ICD-10-CM

## 2020-12-07 DIAGNOSIS — O99013 Anemia complicating pregnancy, third trimester: Secondary | ICD-10-CM

## 2020-12-07 DIAGNOSIS — O0933 Supervision of pregnancy with insufficient antenatal care, third trimester: Secondary | ICD-10-CM | POA: Insufficient documentation

## 2020-12-07 DIAGNOSIS — Z362 Encounter for other antenatal screening follow-up: Secondary | ICD-10-CM | POA: Diagnosis present

## 2020-12-07 DIAGNOSIS — Z3A30 30 weeks gestation of pregnancy: Secondary | ICD-10-CM

## 2020-12-07 NOTE — Progress Notes (Signed)
   Subjective:  Kari Dunn is a 20 y.o. G1P0 at 103w4d being seen today for ongoing prenatal care.  She is currently monitored for the following issues for this low-risk pregnancy and has Supervision of normal first pregnancy, antepartum and Alpha thalassemia silent carrier on their problem list.  Patient reports no complaints.  Contractions: Not present. Vag. Bleeding: None.  Movement: Present. Denies leaking of fluid.   The following portions of the patient's history were reviewed and updated as appropriate: allergies, current medications, past family history, past medical history, past social history, past surgical history and problem list. Problem list updated.  Objective:   Vitals:   12/07/20 1000  BP: 118/82  Pulse: 94  Weight: 205 lb (93 kg)    Fetal Status: Fetal Heart Rate (bpm): 145   Movement: Present     General:  Alert, oriented and cooperative. Patient is in no acute distress.  Skin: Skin is warm and dry. No rash noted.   Cardiovascular: Normal heart rate noted  Respiratory: Normal respiratory effort, no problems with respiration noted  Abdomen: Soft, gravid, appropriate for gestational age. Pain/Pressure: Absent     Pelvic: Vag. Bleeding: None     Cervical exam deferred        Extremities: Normal range of motion.  Edema: None  Mental Status: Normal mood and affect. Normal behavior. Normal judgment and thought content.   Urinalysis:      Assessment and Plan:  Pregnancy: G1P0 at [redacted]w[redacted]d  1. Supervision of normal first pregnancy, antepartum FHR and BP normal Follow up US today normal with complete anatomy  Discussed contraception, still undecided, referred to [email or web address] Would like to maybe have a waterbirth, will schedule w CNM to discuss at next visit and send message to class coordinator  Preterm labor symptoms and general obstetric precautions including but not limited to vaginal bleeding, contractions, leaking of fluid and fetal movement were reviewed in  detail with the patient. Please refer to After Visit Summary for other counseling recommendations.  Return in 2 weeks (on 12/21/2020) for [facility] ob visit.   Venora Maples, MD

## 2020-12-07 NOTE — Patient Instructions (Signed)
 Contraception Choices Contraception, also called birth control, refers to methods or devices that prevent pregnancy. Hormonal methods Contraceptive implant A contraceptive implant is a thin, plastic tube that contains a hormone that prevents pregnancy. It is different from an intrauterine device (IUD). It is inserted into the upper part of the arm by a health care provider. Implants can be effective for up to 3 years. Progestin-only injections Progestin-only injections are injections of progestin, a synthetic form of the hormone progesterone. They are given every 3 months by a health care provider. Birth control pills Birth control pills are pills that contain hormones that prevent pregnancy. They must be taken once a day, preferably at the same time each day. A prescription is needed to use this method of contraception. Birth control patch The birth control patch contains hormones that prevent pregnancy. It is placed on the skin and must be changed once a week for three weeks and removed on the fourth week. A prescription is needed to use this method of contraception. Vaginal ring A vaginal ring contains hormones that prevent pregnancy. It is placed in the vagina for three weeks and removed on the fourth week. After that, the process is repeated with a new ring. A prescription is needed to use this method of contraception. Emergency contraceptive Emergency contraceptives prevent pregnancy after unprotected sex. They come in pill form and can be taken up to 5 days after sex. They work best the sooner they are taken after having sex. Most emergency contraceptives are available without a prescription. This method should not be used as your only form of birth control.   Barrier methods Female condom A female condom is a thin sheath that is worn over the penis during sex. Condoms keep sperm from going inside a woman's body. They can be used with a sperm-killing substance (spermicide) to increase their  effectiveness. They should be thrown away after one use. Female condom A female condom is a soft, loose-fitting sheath that is put into the vagina before sex. The condom keeps sperm from going inside a woman's body. They should be thrown away after one use. Diaphragm A diaphragm is a soft, dome-shaped barrier. It is inserted into the vagina before sex, along with a spermicide. The diaphragm blocks sperm from entering the uterus, and the spermicide kills sperm. A diaphragm should be left in the vagina for 6-8 hours after sex and removed within 24 hours. A diaphragm is prescribed and fitted by a health care provider. A diaphragm should be replaced every 1-2 years, after giving birth, after gaining more than 15 lb (6.8 kg), and after pelvic surgery. Cervical cap A cervical cap is a round, soft latex or plastic cup that fits over the cervix. It is inserted into the vagina before sex, along with spermicide. It blocks sperm from entering the uterus. The cap should be left in place for 6-8 hours after sex and removed within 48 hours. A cervical cap must be prescribed and fitted by a health care provider. It should be replaced every 2 years. Sponge A sponge is a soft, circular piece of polyurethane foam with spermicide in it. The sponge helps block sperm from entering the uterus, and the spermicide kills sperm. To use it, you make it wet and then insert it into the vagina. It should be inserted before sex, left in for at least 6 hours after sex, and removed and thrown away within 30 hours. Spermicides Spermicides are chemicals that kill or block sperm from entering the   cervix and uterus. They can come as a cream, jelly, suppository, foam, or tablet. A spermicide should be inserted into the vagina with an applicator at least 10-15 minutes before sex to allow time for it to work. The process must be repeated every time you have sex. Spermicides do not require a prescription.   Intrauterine  contraception Intrauterine device (IUD) An IUD is a T-shaped device that is put in a woman's uterus. There are two types:  Hormone IUD.This type contains progestin, a synthetic form of the hormone progesterone. This type can stay in place for 3-5 years.  Copper IUD.This type is wrapped in copper wire. It can stay in place for 10 years. Permanent methods of contraception Female tubal ligation In this method, a woman's fallopian tubes are sealed, tied, or blocked during surgery to prevent eggs from traveling to the uterus. Hysteroscopic sterilization In this method, a small, flexible insert is placed into each fallopian tube. The inserts cause scar tissue to form in the fallopian tubes and block them, so sperm cannot reach an egg. The procedure takes about 3 months to be effective. Another form of birth control must be used during those 3 months. Female sterilization This is a procedure to tie off the tubes that carry sperm (vasectomy). After the procedure, the man can still ejaculate fluid (semen). Another form of birth control must be used for 3 months after the procedure. Natural planning methods Natural family planning In this method, a couple does not have sex on days when the woman could become pregnant. Calendar method In this method, the woman keeps track of the length of each menstrual cycle, identifies the days when pregnancy can happen, and does not have sex on those days. Ovulation method In this method, a couple avoids sex during ovulation. Symptothermal method This method involves not having sex during ovulation. The woman typically checks for ovulation by watching changes in her temperature and in the consistency of cervical mucus. Post-ovulation method In this method, a couple waits to have sex until after ovulation. Where to find more information  Centers for Disease Control and Prevention: www.cdc.gov Summary  Contraception, also called birth control, refers to methods or  devices that prevent pregnancy.  Hormonal methods of contraception include implants, injections, pills, patches, vaginal rings, and emergency contraceptives.  Barrier methods of contraception can include female condoms, female condoms, diaphragms, cervical caps, sponges, and spermicides.  There are two types of IUDs (intrauterine devices). An IUD can be put in a woman's uterus to prevent pregnancy for 3-5 years.  Permanent sterilization can be done through a procedure for males and females. Natural family planning methods involve nothaving sex on days when the woman could become pregnant. This information is not intended to replace advice given to you by your health care provider. Make sure you discuss any questions you have with your health care provider. Document Revised: 03/27/2020 Document Reviewed: 03/27/2020 Elsevier Patient Education  2021 [employer] Inc.   Breastfeeding  Choosing to breastfeed is one of the best decisions you can make for yourself and your baby. A change in hormones during pregnancy causes your breasts to make breast milk in your milk-producing glands. Hormones prevent breast milk from being released before your baby is born. They also prompt milk flow after birth. Once breastfeeding has begun, thoughts of your baby, as well as his or her sucking or crying, can stimulate the release of milk from your milk-producing glands. Benefits of breastfeeding Research shows that breastfeeding offers many health benefits   for infants and mothers. It also offers a cost-free and convenient way to feed your baby. For your baby  Your first milk (colostrum) helps your baby's digestive system to function better.  Special cells in your milk (antibodies) help your baby to fight off infections.  Breastfed babies are less likely to develop asthma, allergies, obesity, or type 2 diabetes. They are also at lower risk for sudden infant death syndrome (SIDS).  Nutrients in breast milk are better  able to meet your baby's needs compared to infant formula.  Breast milk improves your baby's brain development. For you  Breastfeeding helps to create a very special bond between you and your baby.  Breastfeeding is convenient. Breast milk costs nothing and is always available at the correct temperature.  Breastfeeding helps to burn calories. It helps you to lose the weight that you gained during pregnancy.  Breastfeeding makes your uterus return faster to its size before pregnancy. It also slows bleeding (lochia) after you give birth.  Breastfeeding helps to lower your risk of developing type 2 diabetes, osteoporosis, rheumatoid arthritis, cardiovascular disease, and breast, ovarian, uterine, and endometrial cancer later in life. Breastfeeding basics Starting breastfeeding  Find a comfortable place to sit or lie down, with your neck and back well-supported.  Place a pillow or a rolled-up blanket under your baby to bring him or her to the level of your breast (if you are seated). Nursing pillows are specially designed to help support your arms and your baby while you breastfeed.  Make sure that your baby's tummy (abdomen) is facing your abdomen.  Gently massage your breast. With your fingertips, massage from the outer edges of your breast inward toward the nipple. This encourages milk flow. If your milk flows slowly, you may need to continue this action during the feeding.  Support your breast with 4 fingers underneath and your thumb above your nipple (make the letter "C" with your hand). Make sure your fingers are well away from your nipple and your baby's mouth.  Stroke your baby's lips gently with your finger or nipple.  When your baby's mouth is open wide enough, quickly bring your baby to your breast, placing your entire nipple and as much of the areola as possible into your baby's mouth. The areola is the colored area around your nipple. ? More areola should be visible above your  baby's upper lip than below the lower lip. ? Your baby's lips should be opened and extended outward (flanged) to ensure an adequate, comfortable latch. ? Your baby's tongue should be between his or her lower gum and your breast.  Make sure that your baby's mouth is correctly positioned around your nipple (latched). Your baby's lips should create a seal on your breast and be turned out (everted).  It is common for your baby to suck about 2-3 minutes in order to start the flow of breast milk. Latching Teaching your baby how to latch onto your breast properly is very important. An improper latch can cause nipple pain, decreased milk supply, and poor weight gain in your baby. Also, if your baby is not latched onto your nipple properly, he or she may swallow some air during feeding. This can make your baby fussy. Burping your baby when you switch breasts during the feeding can help to get rid of the air. However, teaching your baby to latch on properly is still the best way to prevent fussiness from swallowing air while breastfeeding. Signs that your baby has successfully latched onto   your nipple  Silent tugging or silent sucking, without causing you pain. Infant's lips should be extended outward (flanged).  Swallowing heard between every 3-4 sucks once your milk has started to flow (after your let-down milk reflex occurs).  Muscle movement above and in front of his or her ears while sucking. Signs that your baby has not successfully latched onto your nipple  Sucking sounds or smacking sounds from your baby while breastfeeding.  Nipple pain. If you think your baby has not latched on correctly, slip your finger into the corner of your baby's mouth to break the suction and place it between your baby's gums. Attempt to start breastfeeding again. Signs of successful breastfeeding Signs from your baby  Your baby will gradually decrease the number of sucks or will completely stop sucking.  Your baby  will fall asleep.  Your baby's body will relax.  Your baby will retain a small amount of milk in his or her mouth.  Your baby will let go of your breast by himself or herself. Signs from you  Breasts that have increased in firmness, weight, and size 1-3 hours after feeding.  Breasts that are softer immediately after breastfeeding.  Increased milk volume, as well as a change in milk consistency and color by the fifth day of breastfeeding.  Nipples that are not sore, cracked, or bleeding. Signs that your baby is getting enough milk  Wetting at least 1-2 diapers during the first 24 hours after birth.  Wetting at least 5-6 diapers every 24 hours for the first week after birth. The urine should be clear or pale yellow by the age of 5 days.  Wetting 6-8 diapers every 24 hours as your baby continues to grow and develop.  At least 3 stools in a 24-hour period by the age of 5 days. The stool should be soft and yellow.  At least 3 stools in a 24-hour period by the age of 7 days. The stool should be seedy and yellow.  No loss of weight greater than 10% of birth weight during the first 3 days of life.  Average weight gain of 4-7 oz (113-198 g) per week after the age of 4 days.  Consistent daily weight gain by the age of 5 days, without weight loss after the age of 2 weeks. After a feeding, your baby may spit up a small amount of milk. This is normal. Breastfeeding frequency and duration Frequent feeding will help you make more milk and can prevent sore nipples and extremely full breasts (breast engorgement). Breastfeed when you feel the need to reduce the fullness of your breasts or when your baby shows signs of hunger. This is called "breastfeeding on demand." Signs that your baby is hungry include:  Increased alertness, activity, or restlessness.  Movement of the head from side to side.  Opening of the mouth when the corner of the mouth or cheek is stroked (rooting).  Increased  sucking sounds, smacking lips, cooing, sighing, or squeaking.  Hand-to-mouth movements and sucking on fingers or hands.  Fussing or crying. Avoid introducing a pacifier to your baby in the first 4-6 weeks after your baby is born. After this time, you may choose to use a pacifier. Research has shown that pacifier use during the first year of a baby's life decreases the risk of sudden infant death syndrome (SIDS). Allow your baby to feed on each breast as long as he or she wants. When your baby unlatches or falls asleep while feeding from the   first breast, offer the second breast. Because newborns are often sleepy in the first few weeks of life, you may need to awaken your baby to get him or her to feed. Breastfeeding times will vary from baby to baby. However, the following rules can serve as a guide to help you make sure that your baby is properly fed:  Newborns (babies 4 weeks of age or younger) may breastfeed every 1-3 hours.  Newborns should not go without breastfeeding for longer than 3 hours during the day or 5 hours during the night.  You should breastfeed your baby a minimum of 8 times in a 24-hour period. Breast milk pumping Pumping and storing breast milk allows you to make sure that your baby is exclusively fed your breast milk, even at times when you are unable to breastfeed. This is especially important if you go back to work while you are still breastfeeding, or if you are not able to be present during feedings. Your lactation consultant can help you find a method of pumping that works best for you and give you guidelines about how long it is safe to store breast milk.      Caring for your breasts while you breastfeed Nipples can become dry, cracked, and sore while breastfeeding. The following recommendations can help keep your breasts moisturized and healthy:  Avoid using soap on your nipples.  Wear a supportive bra designed especially for nursing. Avoid wearing underwire-style  bras or extremely tight bras (sports bras).  Air-dry your nipples for 3-4 minutes after each feeding.  Use only cotton bra pads to absorb leaked breast milk. Leaking of breast milk between feedings is normal.  Use lanolin on your nipples after breastfeeding. Lanolin helps to maintain your skin's normal moisture barrier. Pure lanolin is not harmful (not toxic) to your baby. You may also hand express a few drops of breast milk and gently massage that milk into your nipples and allow the milk to air-dry. In the first few weeks after giving birth, some women experience breast engorgement. Engorgement can make your breasts feel heavy, warm, and tender to the touch. Engorgement peaks within 3-5 days after you give birth. The following recommendations can help to ease engorgement:  Completely empty your breasts while breastfeeding or pumping. You may want to start by applying warm, moist heat (in the shower or with warm, water-soaked hand towels) just before feeding or pumping. This increases circulation and helps the milk flow. If your baby does not completely empty your breasts while breastfeeding, pump any extra milk after he or she is finished.  Apply ice packs to your breasts immediately after breastfeeding or pumping, unless this is too uncomfortable for you. To do this: ? Put ice in a plastic bag. ? Place a towel between your skin and the bag. ? Leave the ice on for 20 minutes, 2-3 times a day.  Make sure that your baby is latched on and positioned properly while breastfeeding. If engorgement persists after 48 hours of following these recommendations, contact your health care provider or a lactation consultant. Overall health care recommendations while breastfeeding  Eat 3 healthy meals and 3 snacks every day. Well-nourished mothers who are breastfeeding need an additional 450-500 calories a day. You can meet this requirement by increasing the amount of a balanced diet that you eat.  Drink  enough water to keep your urine pale yellow or clear.  Rest often, relax, and continue to take your prenatal vitamins to prevent fatigue, stress, and low   vitamin and mineral levels in your body (nutrient deficiencies).  Do not use any products that contain nicotine or tobacco, such as cigarettes and e-cigarettes. Your baby may be harmed by chemicals from cigarettes that pass into breast milk and exposure to secondhand smoke. If you need help quitting, ask your health care provider.  Avoid alcohol.  Do not use illegal drugs or marijuana.  Talk with your health care provider before taking any medicines. These include over-the-counter and prescription medicines as well as vitamins and herbal supplements. Some medicines that may be harmful to your baby can pass through breast milk.  It is possible to become pregnant while breastfeeding. If birth control is desired, ask your health care provider about options that will be safe while breastfeeding your baby. Where to find more information: [employer]: www.llli.org Contact a health care provider if:  You feel like you want to stop breastfeeding or have become frustrated with breastfeeding.  Your nipples are cracked or bleeding.  Your breasts are red, tender, or warm.  You have: ? Painful breasts or nipples. ? A swollen area on either breast. ? A fever or chills. ? Nausea or vomiting. ? Drainage other than breast milk from your nipples.  Your breasts do not become full before feedings by the fifth day after you give birth.  You feel sad and depressed.  Your baby is: ? Too sleepy to eat well. ? Having trouble sleeping. ? More than 1 week old and wetting fewer than 6 diapers in a 24-hour period. ? Not gaining weight by 5 days of age.  Your baby has fewer than 3 stools in a 24-hour period.  Your baby's skin or the white parts of his or her eyes become yellow. Get help right away if:  Your baby is overly tired  (lethargic) and does not want to wake up and feed.  Your baby develops an unexplained fever. Summary  Breastfeeding offers many health benefits for infant and mothers.  Try to breastfeed your infant when he or she shows early signs of hunger.  Gently tickle or stroke your baby's lips with your finger or nipple to allow the baby to open his or her mouth. Bring the baby to your breast. Make sure that much of the areola is in your baby's mouth. Offer one side and burp the baby before you offer the other side.  Talk with your health care provider or lactation consultant if you have questions or you face problems as you breastfeed. This information is not intended to replace advice given to you by your health care provider. Make sure you discuss any questions you have with your health care provider. Document Revised: 01/15/2018 Document Reviewed: 11/22/2016 Elsevier Patient Education  2021 [employer] Inc.  

## 2020-12-20 ENCOUNTER — Ambulatory Visit (INDEPENDENT_AMBULATORY_CARE_PROVIDER_SITE_OTHER): Payer: Medicaid Other

## 2020-12-20 ENCOUNTER — Other Ambulatory Visit: Payer: Self-pay

## 2020-12-20 VITALS — BP 128/84 | HR 99 | Wt 208.0 lb

## 2020-12-20 DIAGNOSIS — Z3400 Encounter for supervision of normal first pregnancy, unspecified trimester: Secondary | ICD-10-CM

## 2020-12-20 DIAGNOSIS — Z3A32 32 weeks gestation of pregnancy: Secondary | ICD-10-CM

## 2020-12-20 NOTE — Patient Instructions (Addendum)
https://www.nichd.nih.gov/health/topics/labor-delivery/Pages/default.aspx">  Third Trimester of Pregnancy  The third trimester of pregnancy is from week 28 through week 40. This is months 7 through 9. The third trimester is a time when the unborn baby (fetus) is growing rapidly. At the end of the ninth month, the fetus is about 20 inches long and weighs 6-10 pounds. Body changes during your third trimester During the third trimester, your body will continue to go through many changes. The changes vary and generally return to normal after your baby is born. Physical changes  Your weight will continue to increase. You can expect to gain 25-35 pounds (11-16 kg) by the end of the pregnancy if you begin pregnancy at a normal weight. If you are underweight, you can expect to gain 28-40 lb (about 13-18 kg), and if you are overweight, you can expect to gain 15-25 lb (about 7-11 kg).  You may begin to get stretch marks on your hips, abdomen, and breasts.  Your breasts will continue to grow and may hurt. A yellow fluid (colostrum) may leak from your breasts. This is the first milk you are producing for your baby.  You may have changes in your hair. These can include thickening of your hair, rapid growth, and changes in texture. Some people also have hair loss during or after pregnancy, or hair that feels dry or thin.  Your belly button may stick out.  You may notice more swelling in your hands, face, or ankles. Health changes  You may have heartburn.  You may have constipation.  You may develop hemorrhoids.  You may develop swollen, bulging veins (varicose veins) in your legs.  You may have increased body aches in the pelvis, back, or thighs. This is due to weight gain and increased hormones that are relaxing your joints.  You may have increased tingling or numbness in your hands, arms, and legs. The skin on your abdomen may also feel numb.  You may feel short of breath because of your  expanding uterus. Other changes  You may urinate more often because the fetus is moving lower into your pelvis and pressing on your bladder.  You may have more problems sleeping. This may be caused by the size of your abdomen, an increased need to urinate, and an increase in your body's metabolism.  You may notice the fetus "dropping," or moving lower in your abdomen (lightening).  You may have increased vaginal discharge.  You may notice that you have pain around your pelvic bone as your uterus distends. Follow these instructions at home: Medicines  Follow your health care provider's instructions regarding medicine use. Specific medicines may be either safe or unsafe to take during pregnancy. Do not take any medicines unless approved by your health care provider.  Take a prenatal vitamin that contains at least 600 micrograms (mcg) of folic acid. Eating and drinking  Eat a healthy diet that includes fresh fruits and vegetables, whole grains, good sources of protein such as meat, eggs, or tofu, and low-fat dairy products.  Avoid raw meat and unpasteurized juice, milk, and cheese. These carry germs that can harm you and your baby.  Eat 4 or 5 small meals rather than 3 large meals a day.  You may need to take these actions to prevent or treat constipation: ? Drink enough fluid to keep your urine pale yellow. ? Eat foods that are high in fiber, such as beans, whole grains, and fresh fruits and vegetables. ? Limit foods that are high in fat and   processed sugars, such as fried or sweet foods. Activity  Exercise only as directed by your health care provider. Most people can continue their usual exercise routine during pregnancy. Try to exercise for 30 minutes at least 5 days a week. Stop exercising if you experience contractions in the uterus.  Stop exercising if you develop pain or cramping in the lower abdomen or lower back.  Avoid heavy lifting.  Do not exercise if it is very hot or  humid or if you are at a high altitude.  If you choose to, you may continue to have sex unless your health care provider tells you not to. Relieving pain and discomfort  Take frequent breaks and rest with your legs raised (elevated) if you have leg cramps or low back pain.  Take warm sitz baths to soothe any pain or discomfort caused by hemorrhoids. Use hemorrhoid cream if your health care provider approves.  Wear a supportive bra to prevent discomfort from breast tenderness.  If you develop varicose veins: ? Wear support hose as told by your health care provider. ? Elevate your feet for 15 minutes, 3-4 times a day. ? Limit salt in your diet. Safety  Talk to your health care provider before traveling far distances.  Do not use hot tubs, steam rooms, or saunas.  Wear your seat belt at all times when driving or riding in a car.  Talk with your health care provider if someone is verbally or physically abusive to you. Preparing for birth To prepare for the arrival of your baby:  Take prenatal classes to understand, practice, and ask questions about labor and delivery.  Visit the hospital and tour the maternity area.  Purchase a rear-facing car seat and make sure you know how to install it in your car.  Prepare the baby's room or sleeping area. Make sure to remove all pillows and stuffed animals from the baby's crib to prevent suffocation. General instructions  Avoid cat litter boxes and soil used by cats. These carry germs that can cause birth defects in the baby. If you have a cat, ask someone to clean the litter box for you.  Do not douche or use tampons. Do not use scented sanitary pads.  Do not use any products that contain nicotine or tobacco, such as cigarettes, e-cigarettes, and chewing tobacco. If you need help quitting, ask your health care provider.  Do not use any herbal remedies, illegal drugs, or medicines that were not prescribed to you. Chemicals in these products  can harm your baby.  Do not drink alcohol.  You will have more frequent prenatal exams during the third trimester. During a routine prenatal visit, your health care provider will do a physical exam, perform tests, and discuss your overall health. Keep all follow-up visits. This is important. Where to find more information  American Pregnancy Association: americanpregnancy.org  American College of Obstetricians and Gynecologists: acog.org/en/Womens%20Health/Pregnancy  Office on Women's Health: womenshealth.gov/pregnancy Contact a health care provider if you have:  A fever.  Mild pelvic cramps, pelvic pressure, or nagging pain in your abdominal area or lower back.  Vomiting or diarrhea.  Bad-smelling vaginal discharge or foul-smelling urine.  Pain when you urinate.  A headache that does not go away when you take medicine.  Visual changes or see spots in front of your eyes. Get help right away if:  Your water breaks.  You have regular contractions less than 5 minutes apart.  You have spotting or bleeding from your vagina.  You   have severe abdominal pain.  You have difficulty breathing.  You have chest pain.  You have fainting spells.  You have not felt your baby move for the time period told by your health care provider.  You have new or increased pain, swelling, or redness in an arm or leg. Summary  The third trimester of pregnancy is from week 28 through week 40 (months 7 through 9).  You may have more problems sleeping. This can be caused by the size of your abdomen, an increased need to urinate, and an increase in your body's metabolism.  You will have more frequent prenatal exams during the third trimester. Keep all follow-up visits. This is important. This information is not intended to replace advice given to you by your health care provider. Make sure you discuss any questions you have with your health care provider. Document Revised: 03/29/2020 Document  Reviewed: 02/03/2020 Elsevier Patient Education  2021 Elsevier Inc.  AREA PEDIATRIC/FAMILY PRACTICE PHYSICIANS  [place] ([address]) . [facility] Melodie Bouillon, MD; Lum Babe, MD; Sheffield Slider, MD; Leveda Anna, MD; McDiarmid, MD; Jerene Bears, MD; Jennette Kettle, MD; Gwendolyn Grant, MD o [address] [place] [place], [place] [address] o [phone] o Mon-Fri 8:30-12:30, 1:30-5:00 o Providers come to see babies at [facility] o Accepting Medicaid . [facility] [place] o Limited providers who accept newborns: Docia Chuck, MD; Kateri Plummer, MD; Paulino Rily, MD o [address], [place], [place] [address] o [phone] o Mon-Fri 8:00-5:30 o Babies seen by providers at [facility] o Does NOT accept Medicaid o Please call early in hospitalization for appointment (limited availability)  . [facility] Fatima Sanger, MD o [address], [place], [place] [address] o [phone] o Mon, Tue, Thur, Fri 8:30-5:00, Wed 10:00-7:00 (closed 1-2pm) o Babies seen by [facility] providers o Accepting Medicaid . Donnie Coffin - Pediatrician Fae Pippin, MD o [address], [place], [place] [address] o [phone] o Mon-Fri 8:30-5:00, Sat 8:30-12:00 o Provider comes to see babies at [facility] o Accepting Medicaid o Must have been referred from current patients or contacted office prior to delivery . [facility]) Leotis Pain, MD; Ave Filter, MD; Luna Fuse, MD; Kennedy Bucker, MD; Konrad Dolores, MD; Kathlene November, MD; Jenne Campus, MD; Lubertha South, MD; Wynetta Emery, MD; Duffy Rhody, MD; Gerre Couch, NP; Shirl Harris, NP o [address] [place], [place], [place] [address] o [phone] o Mon, Tue, Thur, Fri 8:30-5:30, Wed 9:30-5:30, Sat 8:30-12:30 o Babies seen by [facility] providers o Accepting Medicaid o Only accepting infants of first-time parents or siblings of current patients [facility] discharge  coordinator will make follow-up appointment . Cyril Mourning o [address], [place], [place]  [address] o [phone]   Fax - [phone] . [facility] o [address], [place], [place]  [address] o Phone - [phone]   Fax - [phone] . Lucio Edward o [address], [place], [place]  [address] o [phone]  [place] [phone]) . [facility] Jorge Mandril, MD; Alita Chyle, MD; Princella Ion, MD; MD; Earlene Plater, MD; Jamesetta Orleans, MD; Alvera Novel, MD; Clarene Duke, MD; Rana Snare, MD; Carmon Ginsberg, MD; Alinda Money, MD; Hosie Poisson, MD; Mayford Knife, MD o [address] [place], [place] [address] o [phone] o Mon-Fri 8:30-5:00 (extended evenings Mon-Thur as needed), Sat-Sun 10:00-1:00 o Providers come to see babies at [facility] o Accepting Medicaid for families of first-time babies and families with all children in the household age 50 and under. Must  register with office prior to making appointment (M-F only). [facility] Odella Aquas, NP; Lynelle Doctor, MD; Susann Givens, MD; [place], [place] o [address], [place], [place] [address] o [phone] o Mon-Fri 8:00-5:00 o Babies seen by providers at [facility] o Does NOT accept Medicaid/Commercial Insurance Only . [facility] [place] ([facility])  Suzette Battiest, MD; Zachery Dauer, MD; Stefan Church, MD; Sabino Dick, MD; Quitman Livings, MD; Farris Has, MD; Gaynell Face, MD; Betha Loa, MD; Colon Flattery, MD; Clifton James, MD o [address] [place] [place], [place] [address] o [phone] o Mon-Fri 8:30-5:30, Sat (Oct.-Mar.) 9:00-1:00 o Babies seen by providers at [facility] o Accepting [facility] [phone]) . [facility] Dimitri, MD; Sheliah Hatch, MD o [address], [place], [place] [address] o [phone] o Mon-Fri 8:30-5:00, Sat 8:30-12:00 o Providers come to see babies at [facility] o Does NOT accept Medicaid . [facility] Cindy Hazy, [place];  Orin, MD; [place], [place]; Wynelle Link, MD; Azucena Cecil, MD o [address], [place], [place] [address] o [phone] o Mon-Fri 8:00-5:00 o Babies seen by providers at [facility] o Does NOT accept Medicaid o Only accepting babies of parents who are patients o Please call early in hospitalization for appointment (limited availability) . [facility] Pediatricians Lamar Benes, MD; Abran Cantor, MD; Early Osmond, MD; Cherre Huger, NP; Hyacinth Meeker, MD; Dwan Bolt, MD; Jarold Motto, NP; Dario Guardian, MD; Talmage Nap, MD; Maisie Fus, MD; Pricilla Holm, MD; Tama High, MD o [address] [place], [place], [place] [address] o [phone] o Mon-Fri 8:00-5:00, Sat 9:00-12:00 o Providers come to see babies at [facility] o Does NOT accept [facility] [phone]) . [facility] at [facility] o Limited providers accepting new patients: Drema Pry, NP; [place], PA o [address] [place], [place] [address] o [phone] o Mon-Fri 8:00-5:00 o Babies seen by providers at [facility] o Does NOT accept Medicaid o Only accepting babies of parents who are patients o Please call early in hospitalization for appointment (limited availability) . [facility] Luan Pulling, MD; Nash Dimmer, MD o [address] [place] [place], [place] [address] o [phone] (press 1 to schedule appointment) o Mon-Fri 8:00-5:00 o Providers come to see babies at [facility] o Does NOT accept Medicaid . [facility] Cristino Martes, MD o [address] [place], [place] [address] o [phone] o Mon-Fri 8:30-5:00 (lunch 12:30-1:00), extended hours by appointment only Wed 5:00-6:30 o Babies seen by [facility] providers o Accepting Medicaid . [facility] Abbot, MD; [place], MD; Hassan Rowan, MD o [address] [place] [address] [place], [place], [place] [address] o [phone] o Mon-Fri 8:00-5:00 o Babies seen by [facility] providers o Does NOT accept Medicaid . Nature conservation officer at [place] [address] [place] Soho, MD; Durene Cal,  MD; Mary Esther, DO o [address] [place], [place] [address] o [phone] o Mon-Fri 8:00-5:00 o Babies seen by [facility] providers o Does NOT accept Medicaid . [facility] o [place], [place]; [place], [place]; [place], NP; Avis Epley, MD; Vonna Kotyk, MD; Clance Boll, MD; Stevphen Rochester, NP; Arvilla Market, NP; Ann Maki, NP; Otis Dials, NP; Vaughan Basta, MD; [place], MD o [address] [place], [place] [address] o [phone] o Mon-Fri 8:30-5:00, Sat 10:00-1:00 o Providers come to see babies at [facility] o Does NOT accept Medicaid o Free prenatal information session Tuesdays at 4:45pm . [facility] Luna Kitchens, MD; [place], [place]; [place], [place]; [place], [place] o [address] [place] [address] o [phone] o Mon-Fri 7:30-5:30 o Babies seen by [facility]  providers . [facility] o [address], [place], [place]  [address] o [phone]   Fax - [phone]  [place] [phone] & [phone]) . [facility] Alphonsa Overall, MD o [address] [place], [place] [address] o [phone] o Mon-Thur 8:00-6:00 o Providers come to see babies at [facility] o Accepting Medicaid . [facility] Zenon Mayo, NP; Cyndia Bent, MD; [place], [place]; [place], [place] o [address] [place], [place] [address] o [phone] o Mon-Thur 7:30-7:30, Fri 7:30-4:30 o Babies seen by [facility] providers o Accepting Medicaid . [facility] Cheryle Horsfall, MD; Janene Harvey, NP; Vonita Moss, MD o [address], [place], [place] [address] o [phone] o Mon-Fri 8:30-5:00, Sat 8:30-12:00 o Providers come to see babies at [facility] o Accepting Medicaid o Must have "Meet & Greet" appointment at office prior to delivery . [facility] [place] ([facility]) Llana Aliment, MD; Earlene Plater, MD; Lucretia Roers, MD o [address], [place], [place] [address] o [phone] o Mon-Wed 8:00-6:00,  Thur-Fri 8:00-5:00, Sat 9:00-12:00 o Providers come to see babies at [facility] o Does NOT accept Medicaid o Only accepting siblings of current patients . [facility]  o [address], [place], [place]  [address] o [phone]   Fax - [phone] . [facility] at [facility] o [address], [place], [place]  [address] o [phone]   Fax - [phone]  [place] [phone] & [phone]) . Nature conservation officer at [facility] o [place], DO; [place], DO o [address] [place], [place] [address] o [phone] o Mon-Fri 7:00-5:00 o Babies seen by [facility] providers o Does NOT accept Medicaid . [facility] Ellis Savage, MD; [place], [place]; [place], [place] o [address], [place], [place] [address] o [phone] o Mon-Fri 8:00-5:00 o Babies seen by [facility] providers o Accepting Medicaid . [facility] - [address] Franne Forts, MD; [place], [place]; [place], NP; [place], [place] o [address] [place], [place], [place] [address] o [phone] o Mon-Fri 8:00-5:00 o Babies seen by providers at [facility] o Accepting [place] [facility] [place] [phone]) . [facility] [place], [place] o [address] [place], [place] [address] o [phone] o Mon-Fri 8:00-5:00 o Babies seen by [facility] providers o Does NOT accept Medicaid o Limited availability, please call early in hospitalization to schedule follow-up . [facility] Jolee Ewing, PA; Eddie Candle, MD; [place], MD; [place], [place]; Constance Goltz, MD; [place], [place] o [facility] [address], [place], [place] [address] o [phone] o Mon-Fri 8:30-5:00, Sat 9:00-12:00 o Babies seen by providers at [facility] o Accepting Medicaid o Please register online then schedule online or call office o [email or web address] . [facility]  [facility] - [facility]) Samuella Bruin, NP; Lucianne Muss, MD; Lanier Clam, PA o [address], [place], [place] [address] o [phone] o Mon-Fri 8:00-5:00 o Babies seen by providers at [facility] o Accepting Medicaid . [facility] [place] ([facility] [employer]) Sharin Mons, MD; Reed Breech, NP; Shelva Majestic, MD o [address], [place], [place] [address] o [phone] o Mon-Fri 8:00-5:30, Sat&Sun by appointment (phones open at 8:30) o Babies seen by [facility] providers o Accepting Medicaid o Must be a first-time baby or sibling of current patient . [facility]  [place]  o [address], [address], [place], [place]  [address] o [phone]   Fax - [phone]  [place] [phone] & [phone]) . [facility] o [place], [place]; [place], [place]; Dimple Caseyice, MD; [place], [place]; Carolyne FiscalSpry, MD o [address] [place], [place] [address] o [phone] o Mon-Thur 8:00-7:00, Fri 8:00-5:00, Sat 8:00-12:00, Sun 9:00-12:00 o Babies seen by [facility] providers o Accepting Medicaid . [facility] o Coe-Goins, MD; Gaynell FaceMarshall, MD; [facility] Center[facility]re-Louis, MD o [address], [place], [place] [address] o [phone] o Mon-Thur 8:00-5:00 o Babies seen by providers at [facility] o Accepting Medicaid . [facility] at [place] Gwenlyn Sarano Bratton, MD; Coe-Goins, MD; Madilyn FiremanHayes, MD; Melvyn NethLewis, MD; List, MD; Lazarus SalinesLott, MD; Gaynell FaceMarshall, MD; Berneda RoseMoran, MD; Flora Lipps'Neal, MD; Beryl MeagerPierre-Louis, MD; Luther RedoPitonzo, MD; Lavonia DraftsScholer, MD; Kellie SimmeringSpangle, MD o [address] [place], [place] [address] o [phone] o Mon-Fri 8:00-5:30, Sat (Oct.-Mar.) 9:00-1:00 o Babies seen by providers at [facility] o Accepting Medicaid o Must fill out new patient packet, available online at [email or web address] . [facility] [address] [facility]) Simone Curiao Friddle, NP; Tiburcio PeaHarris, NP; Tresa EndoKelly, NP; Whitney PostLogan, MD; [place], [place]; Hennie DuosPoth, MD; Wynne Dustamadoss, MD; Kavin LeechStanton, NP o [address], [place], [place] [address] o [phone] o Mon-Thur 8:00-5:30, Fri 8:00-5:00 o Babies seen by providers at [facility] o Accepting [facility]  [place] [phone] . [facility] o [address], [place]; GoddardDurham, MD; Tanya NonesPickard, MD; [place] Soap[place]ia, [place] o [address], [place], [place] [address] o [phone] o Mon-Fri 8:00-5:00 o Babies seen by providers at [facility] o Accepting [facility]   [place] [phone]) . [facility] o SpringertonMasneri, DO; Lenise ArenaMeyers, MD; [place], [place] o [address], [place], [place] [address] o [phone] o Mon-Fri 8:00-5:00 o Babies seen by providers at [facility] o Does NOT accept Medicaid o Limited appointment availability, please call early in hospitalization  . Nature conservation officerLeBauer Health[facility] at [facility] o BridgeportKunedd, DO; Taft[place]Gow[place], MD o [address], [place], [place] [address] o [phone] o Mon-Fri 8:00-5:00 o Babies seen by [facility] providers o Does NOT accept Medicaid . [facility] Lorrine Kino Cameron, MD; [place] LightsMacDonald, MD; [place], [place]; [place], MD o [address], [place], [place] [address] o [phone] o Mon-Fri 8:00-5:00 o After hours clinic [facility]([address] [place], [place] [address]) [phone] Mon-Fri 5:00-8:00, Sat 12:00-6:00, Sun 10:00-4:00 o Babies seen by [facility] providers o Accepting Medicaid . [facility] o [address], [place], [place]  [address] o [phone]   Fax - [phone]  [place] [phone] . [facility] o Andy, MD o [address] [place], [place], [place] [address] o [phone] o Mon-Fri 8:00-5:00 o Babies [facility] by [facility] providers o Does NOT accept Medicaid . [facility] [place] [facility] [place]) Tomi Likenso Eksir,  MD o [address], [place], [place] [address] o [phone] o Mon-Thur 8:00-7:00, Fri 8:00-5:00, Sat 8:00-12:00 o Babies seen by [facility] at [facility] o Accepting Medicaid - but does not have vaccinations in office (must be received elsewhere) o Limited availability, please call early in hospitalization  [place] ([phone]) . [facility]  o Wyvonne Lenzharlene Flemming, MD o [address], [place] [address] o [phone]  Fax [phone]

## 2020-12-20 NOTE — Progress Notes (Signed)
ROB, needs to discuss Water birth and classes.

## 2020-12-20 NOTE — Progress Notes (Signed)
   LOW-RISK PREGNANCY OFFICE VISIT  Patient name: Kari Dunn MRN [id number]  Date of birth: June 27, 2001 Chief Complaint:   Routine Prenatal Visit  Subjective:   Kari Dunn is a 20 y.o. G1P0 female at [redacted]w[redacted]d with an Estimated Date of Delivery: 02/11/21 being seen today for ongoing management of a low-risk pregnancy aeb has Supervision of normal first pregnancy, antepartum and Alpha thalassemia silent carrier on their problem list.  Patient presents today without complaint. However, she questions how she can go about her candidacy for WB.   Patient endorses fetal movement.  Patient denies vaginal concerns including abnormal discharge, leaking of fluid, and bleeding. Patient also denies abdominal cramping and contractions.  Contractions: Not present. Vag. Bleeding: None.  Movement: Present.  Reviewed past medical,surgical, social, obstetrical and family history as well as problem list, medications and allergies.  Objective   Vitals:   12/20/20 1454  BP: 128/84  Pulse: 99  Weight: 208 lb (94.3 kg)  Body mass index is 32.58 kg/m.  Total Weight Gain:44 lb (20 kg)         Physical Examination:   General appearance: Well appearing, and in no distress  Mental status: Alert, oriented to person, place, and time  Skin: Warm & dry  Cardiovascular: Normal heart rate noted  Respiratory: Normal respiratory effort, no distress  Abdomen: Soft, gravid, nontender, AGA with Fundal height of Fundal Height: 34 cm  Pelvic: Cervical exam deferred           Extremities: Edema: Trace  Fetal Status: Fetal Heart Rate (bpm): 146  Movement: Present   No results found for this or any previous visit (from the past 24 hour(s)).  Assessment & Plan:  Low-risk pregnancy of a 20 y.o., G1P0 at [redacted]w[redacted]d with an Estimated Date of Delivery: 02/11/21   1. Supervision of normal first pregnancy, antepartum -Informed that she is a candidate for WB. -Discussed need to take class and then can sign consent. -Reviewed r/b  of WB and exclusion criteria including but not limited to GHTN, Abnormal FHT, IOL, and staff availability. -Informed that WB is an option and not a guaranteed birthing method.  -Directed to [email or web address] for enrollment of WB course.  Informed next avail course is March 2nd. -Anticipatory guidance for upcoming appts. -Patient to next appt in 2 weeks for an in-person visit.  2. [redacted] weeks gestation of pregnancy -Doing well overall. -Discussed and encouraged to start looking for pediatrician. -Peds List Given      Meds: No orders of the defined types were placed in this encounter.  Labs/procedures today:  Lab Orders  No laboratory test(s) ordered today     Reviewed: Preterm labor symptoms and general obstetric precautions including but not limited to vaginal bleeding, contractions, leaking of fluid and fetal movement were reviewed in detail with the patient.  All questions were answered.  Follow-up: Return in about 2 weeks (around 01/03/2021) for LROB.  No orders of the defined types were placed in this encounter.  Cherre Robins MSN, CNM 12/20/2020

## 2021-01-03 ENCOUNTER — Ambulatory Visit (INDEPENDENT_AMBULATORY_CARE_PROVIDER_SITE_OTHER): Payer: Medicaid Other | Admitting: Women's Health

## 2021-01-03 ENCOUNTER — Other Ambulatory Visit: Payer: Self-pay

## 2021-01-03 VITALS — BP 133/83 | HR 103 | Wt 211.2 lb

## 2021-01-03 DIAGNOSIS — Z3A34 34 weeks gestation of pregnancy: Secondary | ICD-10-CM

## 2021-01-03 DIAGNOSIS — Z23 Encounter for immunization: Secondary | ICD-10-CM

## 2021-01-03 DIAGNOSIS — Z3400 Encounter for supervision of normal first pregnancy, unspecified trimester: Secondary | ICD-10-CM

## 2021-01-03 DIAGNOSIS — O99891 Other specified diseases and conditions complicating pregnancy: Secondary | ICD-10-CM

## 2021-01-03 DIAGNOSIS — Z2839 Other underimmunization status: Secondary | ICD-10-CM | POA: Insufficient documentation

## 2021-01-03 DIAGNOSIS — Z3490 Encounter for supervision of normal pregnancy, unspecified, unspecified trimester: Secondary | ICD-10-CM

## 2021-01-03 DIAGNOSIS — Z283 Underimmunization status: Secondary | ICD-10-CM | POA: Insufficient documentation

## 2021-01-03 DIAGNOSIS — D563 Thalassemia minor: Secondary | ICD-10-CM

## 2021-01-03 NOTE — Progress Notes (Addendum)
Subjective:  Milley Vining is a 20 y.o. G1P0 at [redacted]w[redacted]d being seen today for ongoing prenatal care.  She is currently monitored for the following issues for this low-risk pregnancy and has Supervision of normal first pregnancy, antepartum; Alpha thalassemia silent carrier; and Rubella non-immune status, antepartum on their problem list.  Patient reports no complaints.  Contractions: Not present. Vag. Bleeding: None.  Movement: Present. Denies leaking of fluid.   The following portions of the patient's history were reviewed and updated as appropriate: allergies, current medications, past family history, past medical history, past social history, past surgical history and problem list. Problem list updated.  Objective:   Vitals:   01/03/21 1335  BP: 133/83  Pulse: (!) 103  Weight: 211 lb 3.2 oz (95.8 kg)    Fetal Status: Fetal Heart Rate (bpm): 145 Fundal Height: 35 cm Movement: Present     General:  Alert, oriented and cooperative. Patient is in no acute distress.  Skin: Skin is warm and dry. No rash noted.   Cardiovascular: Normal heart rate noted  Respiratory: Normal respiratory effort, no problems with respiration noted  Abdomen: Soft, gravid, appropriate for gestational age. Pain/Pressure: Absent     Pelvic: Vag. Bleeding: None     Cervical exam deferred        Extremities: Normal range of motion.  Edema: None  Mental Status: Normal mood and affect. Normal behavior. Normal judgment and thought content.   Urinalysis:      Assessment and Plan:  Pregnancy: G1P0 at [redacted]w[redacted]d  1. [redacted] weeks gestation of pregnancy  2. Supervision of normal first pregnancy, antepartum -pt initially declined Tdap but now requests it today -pt is interested in waterbirth, has not completed the consent or class, but is scheduled today for a class at 6pm -discussed contraception, info given -peds list given -GBS/cultures next visit  3. Alpha thalassemia silent carrier -GC with [employer]  Preterm labor  symptoms and general obstetric precautions including but not limited to vaginal bleeding, contractions, leaking of fluid and fetal movement were reviewed in detail with the patient. Please refer to After Visit Summary for other counseling recommendations.  I discussed the assessment and treatment plan with the patient. The patient was provided an opportunity to ask questions and all were answered. The patient agreed with the plan and demonstrated an understanding of the instructions. The patient was advised to call back or seek an in-person office evaluation/go to [facility] at [facility] for any urgent or concerning symptoms. Return in about 2 weeks (around 01/17/2021) for in-person LOB/MIDWIFE ONLY/discuss waterbirth/GBS.   Berdell Nevitt, Odie Sera, NP

## 2021-01-03 NOTE — Patient Instructions (Addendum)
Maternity Assessment Unit (MAU)  The Maternity Assessment Unit (MAU) is located at the [facility] at [facility] The address is: [address], [place], [place], [place] [address]. Please see map below for additional directions.    The Maternity Assessment Unit is designed to help you during your pregnancy, and for up to 6 weeks after delivery, with any pregnancy- or postpartum-related emergencies, if you think you are in labor, or if your water has broken. For example, if you experience nausea and vomiting, vaginal bleeding, severe abdominal or pelvic pain, elevated blood pressure or other problems related to your pregnancy or postpartum time, please come to the Maternity Assessment Unit for assistance.        Contraception Choices Contraception, also called birth control, refers to methods or devices that prevent pregnancy. Hormonal methods Contraceptive implant A contraceptive implant is a thin, plastic tube that contains a hormone that prevents pregnancy. It is different from an intrauterine device (IUD). It is inserted into the upper part of the arm by a health care provider. Implants can be effective for up to 3 years. Progestin-only injections Progestin-only injections are injections of progestin, a synthetic form of the hormone progesterone. They are given every 3 months by a health care provider. Birth control pills Birth control pills are pills that contain hormones that prevent pregnancy. They must be taken once a day, preferably at the same time each day. A prescription is needed to use this method of contraception. Birth control patch The birth control patch contains hormones that prevent pregnancy. It is placed on the skin and must be changed once a week for three weeks and removed on the fourth week. A prescription is needed to use this method of contraception. Vaginal ring A vaginal ring contains hormones that prevent pregnancy. It is  placed in the vagina for three weeks and removed on the fourth week. After that, the process is repeated with a new ring. A prescription is needed to use this method of contraception. Emergency contraceptive Emergency contraceptives prevent pregnancy after unprotected sex. They come in pill form and can be taken up to 5 days after sex. They work best the sooner they are taken after having sex. Most emergency contraceptives are available without a prescription. This method should not be used as your only form of birth control.   Barrier methods Female condom A female condom is a thin sheath that is worn over the penis during sex. Condoms keep sperm from going inside a woman's body. They can be used with a sperm-killing substance (spermicide) to increase their effectiveness. They should be thrown away after one use. Female condom A female condom is a soft, loose-fitting sheath that is put into the vagina before sex. The condom keeps sperm from going inside a woman's body. They should be thrown away after one use. Diaphragm A diaphragm is a soft, dome-shaped barrier. It is inserted into the vagina before sex, along with a spermicide. The diaphragm blocks sperm from entering the uterus, and the spermicide kills sperm. A diaphragm should be left in the vagina for 6-8 hours after sex and removed within 24 hours. A diaphragm is prescribed and fitted by a health care provider. A diaphragm should be replaced every 1-2 years, after giving birth, after gaining more than 15 lb (6.8 kg), and after pelvic surgery. Cervical cap A cervical cap is a round, soft latex or plastic cup that fits over the cervix. It is inserted into the vagina before  sex, along with spermicide. It blocks sperm from entering the uterus. The cap should be left in place for 6-8 hours after sex and removed within 48 hours. A cervical cap must be prescribed and fitted by a health care provider. It should be replaced every 2 years. Sponge A sponge is  a soft, circular piece of polyurethane foam with spermicide in it. The sponge helps block sperm from entering the uterus, and the spermicide kills sperm. To use it, you make it wet and then insert it into the vagina. It should be inserted before sex, left in for at least 6 hours after sex, and removed and thrown away within 30 hours. Spermicides Spermicides are chemicals that kill or block sperm from entering the cervix and uterus. They can come as a cream, jelly, suppository, foam, or tablet. A spermicide should be inserted into the vagina with an applicator at least 10-15 minutes before sex to allow time for it to work. The process must be repeated every time you have sex. Spermicides do not require a prescription.   Intrauterine contraception Intrauterine device (IUD) An IUD is a T-shaped device that is put in a woman's uterus. There are two types:  Hormone IUD.This type contains progestin, a synthetic form of the hormone progesterone. This type can stay in place for 3-5 years.  Copper IUD.This type is wrapped in copper wire. It can stay in place for 10 years. Permanent methods of contraception Female tubal ligation In this method, a woman's fallopian tubes are sealed, tied, or blocked during surgery to prevent eggs from traveling to the uterus. Hysteroscopic sterilization In this method, a small, flexible insert is placed into each fallopian tube. The inserts cause scar tissue to form in the fallopian tubes and block them, so sperm cannot reach an egg. The procedure takes about 3 months to be effective. Another form of birth control must be used during those 3 months. Female sterilization This is a procedure to tie off the tubes that carry sperm (vasectomy). After the procedure, the man can still ejaculate fluid (semen). Another form of birth control must be used for 3 months after the procedure. Natural planning methods Natural family planning In this method, a couple does not have sex on days  when the woman could become pregnant. Calendar method In this method, the woman keeps track of the length of each menstrual cycle, identifies the days when pregnancy can happen, and does not have sex on those days. Ovulation method In this method, a couple avoids sex during ovulation. Symptothermal method This method involves not having sex during ovulation. The woman typically checks for ovulation by watching changes in her temperature and in the consistency of cervical mucus. Post-ovulation method In this method, a couple waits to have sex until after ovulation. Where to find more information  Centers for Disease Control and Prevention: FootballExhibition.com.br Summary  Contraception, also called birth control, refers to methods or devices that prevent pregnancy.  Hormonal methods of contraception include implants, injections, pills, patches, vaginal rings, and emergency contraceptives.  Barrier methods of contraception can include female condoms, female condoms, diaphragms, cervical caps, sponges, and spermicides.  There are two types of IUDs (intrauterine devices). An IUD can be put in a woman's uterus to prevent pregnancy for 3-5 years.  Permanent sterilization can be done through a procedure for males and females. Natural family planning methods involve nothaving sex on days when the woman could become pregnant. This information is not intended to replace advice given to you  by your health care provider. Make sure you discuss any questions you have with your health care provider. Document Revised: 03/27/2020 Document Reviewed: 03/27/2020 Elsevier Patient Education  2021 Elsevier Inc.       AREA PEDIATRIC/FAMILY PRACTICE PHYSICIANS  [facility] [address] [place], [place] [address] Phone - [phone]   Fax - [phone]  JACK AMOS [address] [place], [place]  [address] Phone - [phone]   Fax - [phone]  [facility] [address] [place], [place]  [address] Phone - [phone]   Fax - [phone]  [facility] [address] [place], [place]  [address] Phone - [phone]   Fax - [phone]  [facility] [address] [place], [place]  [address] Phone - [phone]   Fax - [phone]  [facility] [address], [address] [place], [place]  [address] Phone - [phone]   Fax - [phone]  [facility] [address], [address] [place], [place]  [address] Phone - [phone]   Fax - [phone]  [facility] AT [facility] [address] [place], [address] [place], [place]  [address] Phone - [phone]   Fax - [phone]  [facility] AT [facility] [address] [place], [place]  [address] Phone - [phone]   Fax - [phone] [facility] [place] [address] [place], [place]  [address] Phone - [phone]   Fax - [phone]  [facility] [address] [place], [place]  [address] Phone - [phone]   Fax - [phone]  [facility] [address], [address] [place], [place]  [address] Phone - [phone]   Fax - [phone]  [facility] [place] [address] [place], [place]  [address] Phone - [phone]   Fax - [phone]  [facility] [address] [place], [place]  [address] Phone - [phone]  [facility] [address] [place], [place]  [address] Phone - [phone]   Fax - [phone]  [facility] [address] [place], [place]  [address] Phone - [phone]   Fax - [phone]  [facility] [address] [place], [place]  [address] Phone - [phone]   Fax - [phone]  [facility] [address] [place], [place]  [address] Phone - [phone]   Fax -  [phone]   [facility] [address] [place], [place]  [address] Phone - [phone]   Fax - [phone]  [facility] [address], [address] [place], [place]  [address] Phone - [phone]   Fax - [phone]  DAVID RUBIN [address] [place], [place]  [address] Phone - [phone]   Fax - [phone]  [facility] [address] [place], [place]  [address] Phone - [phone]   Fax - [phone]  [facility] Al[facility] Chyle [address] [place], [place]  [address] Phone - [phone]   Fax - [phone] Gerarda Fraction [address] [place], [place]  [address] Phone - [phone]   Fax - [phone]  [facility] [address] [place], [place]  [address] Phone - [phone]   Fax - [phone]  [facility] [place] [address] [place], [place]  [address] Phone - [phone]  Fax - [phone]          Group B Streptococcus Test During Pregnancy Why am I having this test? Routine testing, also called screening, for group B streptococcus (GBS) is recommended for all pregnant women between the 36th and 37th week of pregnancy. GBS is a type of bacteria that can be passed from mother to baby during childbirth. Screening will help guide whether or not you will need treatment during labor and delivery to prevent complications such as:  An infection in your uterus during labor.  An infection in your uterus after delivery.  A serious infection in your baby after delivery, such as pneumonia, meningitis, or sepsis. GBS screening is not often done before 36 weeks of pregnancy unless you go into labor prematurely. What happens if I have group B streptococcus? If testing shows that you have GBS, your health care provider will recommend treatment with IV antibiotics during labor and delivery. This treatment  significantly decreases the risk of complications for you and your baby. If you have a planned C-section and you have GBS, you may not need to be treated with antibiotics because GBS is usually passed to babies after labor starts and your water breaks. If you are in labor or your water breaks before your C-section, it is possible for GBS to get into your uterus and be passed to your baby, so you might need treatment. Is there a chance I may not need to be tested? You may not need to be tested for GBS if:  You have a urine test that shows GBS before 36 to 37 weeks.  You had a baby with GBS infection after a previous delivery. In these cases, you will automatically be treated for GBS during labor and delivery. What is being tested? This test is done to check if you have group B streptococcus in your vagina or rectum. What kind of sample is taken? To collect samples for this test, your health care provider will swab your vagina and rectum with a cotton swab. The sample is then sent to the lab to see if GBS is present. What happens during the test?  You will remove your clothing from the waist down.  You will lie down on an exam table in the same position as you would for a pelvic exam.  Your health care provider will swab your vagina and rectum to collect samples for a culture test.  You will be able to go home after the test and do all your usual activities.   How are the results reported? The test results are reported as positive or negative. What do the results mean?  A positive test means you are at risk for passing GBS to your baby during labor and delivery. Your health care provider will recommend that you are treated with an IV antibiotic during labor and delivery.  A negative test means you are at very low risk of passing GBS to your baby. There is still a low risk of passing GBS to your baby because sometimes test results may report that you do not have a condition when you do  (false-negative result) or there is a chance that you may become infected with GBS after the test is done. You most likely will not need to be treated with an antibiotic during labor and delivery. Talk with your health care provider about what your results mean. Questions to ask your health care provider Ask your health care provider, or the department that is  doing the test:  When will my results be ready?  How will I get my results?  What are my treatment options? Summary  Routine testing (screening) for group B streptococcus (GBS) is recommended for all pregnant women between the 36th and 37th week of pregnancy.  GBS is a type of bacteria that can be passed from mother to baby during childbirth.  If testing shows that you have GBS, your health care provider will recommend that you are treated with IV antibiotics during labor and delivery. This treatment almost always prevents infection in newborns. This information is not intended to replace advice given to you by your health care provider. Make sure you discuss any questions you have with your health care provider. Document Revised: 08/22/2020 Document Reviewed: 11/18/2018 Elsevier Patient Education  2021 Elsevier Inc.       Tdap (Tetanus, Diphtheria, Pertussis) Vaccine: What You Need to Know 1. Why get vaccinated? Tdap vaccine can prevent tetanus, diphtheria, and pertussis. Diphtheria and pertussis spread from person to person. Tetanus enters the body through cuts or wounds.  TETANUS (T) causes painful stiffening of the muscles. Tetanus can lead to serious health problems, including being unable to open the mouth, having trouble swallowing and breathing, or death.  DIPHTHERIA (D) can lead to difficulty breathing, heart failure, paralysis, or death.  PERTUSSIS (aP), also known as "whooping cough," can cause uncontrollable, violent coughing that makes it hard to breathe, eat, or drink. Pertussis can be extremely serious  especially in babies and young children, causing pneumonia, convulsions, brain damage, or death. In teens and adults, it can cause weight loss, loss of bladder control, passing out, and rib fractures from severe coughing. 2. Tdap vaccine Tdap is only for children 7 years and older, adolescents, and adults.  Adolescents should receive a single dose of Tdap, preferably at age 68 or 12 years. Pregnant people should get a dose of Tdap during every pregnancy, preferably during the early part of the third trimester, to help protect the newborn from pertussis. Infants are most at risk for severe, life-threatening complications from pertussis. Adults who have never received Tdap should get a dose of Tdap. Also, adults should receive a booster dose of either Tdap or Td (a different vaccine that protects against tetanus and diphtheria but not pertussis) every 10 years, or after 5 years in the case of a severe or dirty wound or burn. Tdap may be given at the same time as other vaccines. 3. Talk with your health care provider Tell your vaccine provider if the person getting the vaccine:  Has had an allergic reaction after a previous dose of any vaccine that protects against tetanus, diphtheria, or pertussis, or has any severe, life-threatening allergies  Has had a coma, decreased level of consciousness, or prolonged seizures within 7 days after a previous dose of any pertussis vaccine (DTP, DTaP, or Tdap)  Has seizures or another nervous system problem  Has ever had Guillain-Barr Syndrome (also called "GBS")  Has had severe pain or swelling after a previous dose of any vaccine that protects against tetanus or diphtheria In some cases, your health care provider may decide to postpone Tdap vaccination until a future visit. People with minor illnesses, such as a cold, may be vaccinated. People who are moderately or severely ill should usually wait until they recover before getting Tdap vaccine.  Your health  care provider can give you more information. 4. Risks of a vaccine reaction  Pain, redness, or swelling where the shot was  given, mild fever, headache, feeling tired, and nausea, vomiting, diarrhea, or stomachache sometimes happen after Tdap vaccination. People sometimes faint after medical procedures, including vaccination. Tell your provider if you feel dizzy or have vision changes or ringing in the ears.  As with any medicine, there is a very remote chance of a vaccine causing a severe allergic reaction, other serious injury, or death. 5. What if there is a serious problem? An allergic reaction could occur after the vaccinated person leaves the clinic. If you see signs of a severe allergic reaction (hives, swelling of the face and throat, difficulty breathing, a fast heartbeat, dizziness, or weakness), call 9-1-1 and get the person to the nearest hospital. For other signs that concern you, call your health care provider.  Adverse reactions should be reported to the Vaccine Adverse Event Reporting System (VAERS). Your health care provider will usually file this report, or you can do it yourself. Visit the VAERS website at www.vaers.LAgents.nohhs.gov or call 26983714711-509-376-3984. VAERS is only for reporting reactions, and VAERS staff members do not give medical advice. 6. The National Vaccine Injury Compensation Program The [employer] Vaccine Injury Compensation Program (VICP) is a federal program that was created to compensate people who may have been injured by certain vaccines. Claims regarding alleged injury or death due to vaccination have a time limit for filing, which may be as short as two years. Visit the VICP website at SpiritualWord.atwww.hrsa.gov/vaccinecompensation or call (715)684-05021-5803849540 to learn about the program and about filing a claim. 7. How can I learn more?  Ask your health care provider.  Call your local or state health department.  Visit the website of the Food and Drug Administration (FDA) for vaccine  package inserts and additional information at FinderList.nowww.fda.gov/vaccines-blood-biologics/vaccines.  Contact the Centers for Disease Control and Prevention (CDC): ? Call 612-193-69981-220-226-3697 (1-800-CDC-INFO) or ? Visit CDC's website at PicCapture.uywww.cdc.gov/vaccines. Vaccine Information Statement Tdap (Tetanus, Diphtheria, Pertussis) Vaccine (06/09/2020) This information is not intended to replace advice given to you by your health care provider. Make sure you discuss any questions you have with your health care provider. Document Revised: 07/05/2020 Document Reviewed: 07/05/2020 Elsevier Patient Education  2021 Elsevier Inc.        Preterm Labor The normal length of a pregnancy is 39-41 weeks. Preterm labor is when labor starts before 37 completed weeks of pregnancy. Babies who are born prematurely and survive may not be fully developed and may be at an increased risk for long-term problems such as cerebral palsy, developmental delays, and vision and hearing problems. Babies who are born too early may have problems soon after birth. Problems may include regulating blood sugar, body temperature, heart rate, and breathing rate. These babies often have trouble with feeding. The risk of having problems is highest for babies who are born before 34 weeks of pregnancy. What are the causes? The exact cause of this condition is not known. What increases the risk? You are more likely to have preterm labor if you have certain risk factors that relate to your medical history, problems with present and past pregnancies, and lifestyle factors. Medical history  You have abnormalities of the uterus, including a short cervix.  You have STIs (sexually transmitted infections), or other infections of the urinary tract and the vagina.  You have chronic illnesses, such as blood clotting problems, diabetes, or high blood pressure.  You are overweight or underweight. Present and past pregnancies  You have had preterm labor  before.  You are pregnant with twins or other multiples.  You have been diagnosed with a condition in which the placenta covers your cervix (placenta previa).  You waited less than 6 months between giving birth and becoming pregnant again.  Your unborn baby has some abnormalities.  You have vaginal bleeding during pregnancy.  You became pregnant through in vitro fertilization (IVF). Lifestyle and environmental factors  You use tobacco products.  You drink alcohol.  You use street drugs.  You have stress and no social support.  You experience domestic violence.  You are exposed to certain chemicals or environmental pollutants. Other factors  You are younger than age 56 or older than age 57. What are the signs or symptoms? Symptoms of this condition include:  Cramps similar to those that can happen during a menstrual period. The cramps may happen with diarrhea.  Pain in the abdomen or lower back.  Regular contractions that may feel like tightening of the abdomen.  A feeling of increased pressure in the pelvis.  Increased watery or bloody mucus discharge from the vagina.  Water breaking (ruptured amniotic sac). How is this diagnosed? This condition is diagnosed based on:  Your medical history and a physical exam.  A pelvic exam.  An ultrasound.  Monitoring your uterus for contractions.  Other tests, including: ? A swab of the cervix to check for a chemical called fetal fibronectin. ? Urine tests. How is this treated? Treatment for this condition depends on the length of your pregnancy, your condition, and the health of your baby. Treatment may include:  Taking medicines, such as: ? Hormone medicines. These may be given early in pregnancy to help support the pregnancy. ? Medicines to stop contractions. ? Medicines to help mature the baby's lungs. These may be prescribed if the risk of delivery is high. ? Medicines to prevent your baby from developing  cerebral palsy.  Bed rest. If the labor happens before 34 weeks of pregnancy, you may need to stay in the hospital.  Delivery of the baby. Follow these instructions at home:  Do not use any products that contain nicotine or tobacco, such as cigarettes, e-cigarettes, and chewing tobacco. If you need help quitting, ask your health care provider.  Do not drink alcohol.  Take over-the-counter and prescription medicines only as told by your health care provider.  Rest as told by your health care provider.  Return to your normal activities as told by your health care provider. Ask your health care provider what activities are safe for you.  Keep all follow-up visits as told by your health care provider. This is important.   How is this prevented? To increase your chance of having a full-term pregnancy:  Do not use street drugs or medicines that have not been prescribed to you during your pregnancy.  Talk with your health care provider before taking any herbal supplements, even if you have been taking them regularly.  Make sure you gain a healthy amount of weight during your pregnancy.  Watch for infection. If you think that you might have an infection, get it checked right away. Symptoms of infection may include: ? Fever. ? Abnormal vaginal discharge or discharge that smells bad. ? Pain or burning with urination. ? Needing to urinate urgently. ? Frequently urinating or passing small amounts of urine frequently. ? Blood in your urine. ? Urine that smells bad or unusual.  Tell your health care provider if you have had preterm labor before. Contact a health care provider if:  You think you are going into preterm labor.  You have signs or symptoms of preterm labor.  You have symptoms of infection. Get help right away if:  You are having regular, painful contractions every 5 minutes or less.  Your water breaks. Summary  Preterm labor is labor that starts before you reach 37  weeks of pregnancy.  Delivering your baby early increases your baby's risk of developing lifelong problems.  The exact cause of preterm labor is unknown. However, having an abnormal uterus, an STI (sexually transmitted infection), or vaginal bleeding during pregnancy increases your risk for preterm labor.  Keep all follow-up visits as told by your health care provider. This is important.  Contact a health care provider if you have signs or symptoms of preterm labor. This information is not intended to replace advice given to you by your health care provider. Make sure you discuss any questions you have with your health care provider. Document Revised: 11/23/2019 Document Reviewed: 11/23/2019 Elsevier Patient Education  2021 [employer].

## 2021-01-03 NOTE — Progress Notes (Signed)
Patient reports fetal movement, denies pain. Pt reports that she is scheduled for water birth class today at 6.

## 2021-01-18 ENCOUNTER — Other Ambulatory Visit (HOSPITAL_COMMUNITY)
Admission: RE | Admit: 2021-01-18 | Discharge: 2021-01-18 | Disposition: A | Discharge status: Home / Self Care | Payer: Medicaid Other | Source: Ambulatory Visit

## 2021-01-18 ENCOUNTER — Other Ambulatory Visit: Payer: Self-pay

## 2021-01-18 ENCOUNTER — Ambulatory Visit (INDEPENDENT_AMBULATORY_CARE_PROVIDER_SITE_OTHER): Payer: Medicaid Other

## 2021-01-18 VITALS — BP 137/90 | HR 106 | Wt 216.0 lb

## 2021-01-18 DIAGNOSIS — Z3400 Encounter for supervision of normal first pregnancy, unspecified trimester: Secondary | ICD-10-CM | POA: Insufficient documentation

## 2021-01-18 DIAGNOSIS — Z3A36 36 weeks gestation of pregnancy: Secondary | ICD-10-CM

## 2021-01-18 DIAGNOSIS — R030 Elevated blood-pressure reading, without diagnosis of hypertension: Secondary | ICD-10-CM

## 2021-01-18 NOTE — Progress Notes (Signed)
ROB [redacted]w[redacted]d  Wants to discuss water birth  CC:  Wheezing when laying down.

## 2021-01-18 NOTE — Patient Instructions (Signed)
Hypertension During Pregnancy High blood pressure (hypertension) is when the force of blood pumping through the arteries is high enough to cause problems with your health. Arteries are blood vessels that carry blood from the heart throughout the body. Hypertension during pregnancy can cause problems for you and your baby. It can be mild or severe. There are different types of hypertension that can happen during pregnancy. These include:  Chronic hypertension. This happens when you had high blood pressure before you became pregnant, and it continues during the pregnancy. Hypertension that develops before you are [redacted] weeks pregnant and continues during the pregnancy is also called chronic hypertension. If you have chronic hypertension, it will not go away after you have your baby. You will need follow-up visits with your health care provider after you have your baby. Your health care provider may want you to keep taking medicine for your blood pressure.  Gestational hypertension. This is hypertension that develops after the 20th week of pregnancy. Gestational hypertension usually goes away after you have your baby, but your health care provider will need to monitor your blood pressure to make sure that it is getting better.  Postpartum hypertension. This is high blood pressure that was present before delivery and continues after delivery or that starts after delivery. This usually occurs within 48 hours after childbirth but may occur up to 6 weeks after giving birth. When hypertension during pregnancy is severe, it is a medical emergency that requires treatment right away. How does this affect me? Women who have hypertension during pregnancy have a greater chance of developing hypertension later in life or during future pregnancies. In some cases, hypertension during pregnancy can cause serious complications, such as:  Stroke.  Heart attack.  Injury to other organs, such as kidneys, lungs, or  liver.  Preeclampsia.  A condition called hemolysis, elevated liver enzymes, and low platelet count (HELLP) syndrome.  Convulsions or seizures.  Placental abruption. How does this affect my baby? Hypertension during pregnancy can affect your baby. Your baby may:  Be born early (prematurely).  Not weigh as much as he or she should at birth (low birth weight).  Not tolerate labor well, leading to an unplanned cesarean delivery. This condition may also result in a baby's death before birth (stillbirth). What are the risks? There are certain factors that make it more likely for you to develop hypertension during pregnancy. These include:  Having hypertension during a previous pregnancy or a family history of hypertension.  Being overweight.  Being age 35 or older.  Being pregnant for the first time.  Being pregnant with more than one baby.  Becoming pregnant using fertilization methods, such as IVF (in vitro fertilization).  Having other medical problems, such as diabetes, kidney disease, or lupus. What can I do to lower my risk? The exact cause of hypertension during pregnancy is not known. You may be able to lower your risk by:  Maintaining a healthy weight.  Eating a healthy and balanced diet.  Following your health care provider's instructions about treating any long-term conditions that you had before becoming pregnant. It is very important to keep all of your prenatal care appointments. Your health care provider will check your blood pressure and make sure that your pregnancy is progressing as expected. If a problem is found, early treatment can prevent complications.   How is this treated? Treatment for hypertension during pregnancy varies depending on the type of hypertension you have and how serious it is.  If you were   taking medicine for high blood pressure before you became pregnant, talk with your health care provider. You may need to change medicine during  pregnancy because some medicines, like ACE inhibitors, may not be considered safe for your baby.  If you have gestational hypertension, your health care provider may order medicine to treat this during pregnancy.  If you are at risk for preeclampsia, your health care provider may recommend that you take a low-dose aspirin during your pregnancy.  If you have severe hypertension, you may need to be hospitalized so you and your baby can be monitored closely. You may also need to be given medicine to lower your blood pressure.  In some cases, if your condition gets worse, you may need to deliver your baby early. Follow these instructions at home: Eating and drinking  Drink enough fluid to keep your urine pale yellow.  Avoid caffeine.   Lifestyle  Do not use any products that contain nicotine or tobacco. These products include cigarettes, chewing tobacco, and vaping devices, such as e-cigarettes. If you need help quitting, ask your health care provider.  Do not use alcohol or drugs.  Avoid stress as much as possible.  Rest and get plenty of sleep.  Regular exercise can help to reduce your blood pressure. Ask your health care provider what kinds of exercise are best for you. General instructions  Take over-the-counter and prescription medicines only as told by your health care provider.  Keep all prenatal and follow-up visits. This is important. Contact a health care provider if:  You have symptoms that your health care provider told you may require more treatment or monitoring, such as: ? Headaches. ? Nausea or vomiting. ? Abdominal pain. ? Dizziness. ? Light-headedness. Get help right away if:  You have symptoms of serious complications, such as: ? Severe abdominal pain that does not get better with treatment. ? A severe headache that does not get better, blurred vision, or double vision. ? Vomiting that does not get better. ? Sudden, rapid weight gain or swelling in your  hands, ankles, or face. ? Vaginal bleeding. ? Blood in your urine. ? Shortness of breath or chest pain. ? Weakness on one side of your body or difficulty speaking.  Your baby is not moving as much as usual. These symptoms may represent a serious problem that is an emergency. Do not wait to see if the symptoms will go away. Get medical help right away. Call your local emergency services (911 in the [place] Do not drive yourself to the hospital. Summary  Hypertension during pregnancy can cause problems for you and your baby.  Treatment for hypertension during pregnancy varies depending on the type of hypertension you have and how serious it is.  Keep all prenatal and follow-up visits. This is important.  Get help right away if you have symptoms of serious complications related to high blood pressure. This information is not intended to replace advice given to you by your health care provider. Make sure you discuss any questions you have with your health care provider. Document Revised: 07/13/2020 Document Reviewed: 07/13/2020 Elsevier Patient Education  2021 [employer]  

## 2021-01-18 NOTE — Progress Notes (Signed)
LOW-RISK PREGNANCY OFFICE VISIT  Patient name: Kari Dunn MRN [id number]  Date of birth: 2001-04-04 Chief Complaint:   Routine Prenatal Visit  Subjective:   Kari Dunn is a 20 y.o. G1P0 female at [redacted]w[redacted]d with an Estimated Date of Delivery: 02/11/21 being seen today for ongoing management of a Low-risk pregnancy aeb has Supervision of normal first pregnancy, antepartum; Alpha thalassemia silent carrier; and Rubella non-immune status, antepartum on their problem list.  Patient presents today with complaint of wheezing while laying down. She reports the wheezing started 2-3 nights ago, occurs when laying, and that it is resolved with sitting up.  Patient further reports she does not note wheezing upon waking in the morning.  Patient feels that the wheezing is present despite laying position.  She endorses a history of asthma, but no recent treatment and has not taken any allergy medications despite a history of allergies.  Patient denies SOB and chest pain.  In regards to pregnancy, patient endorses fetal movement and denies vaginal concerns including abnormal discharge, leaking of fluid, and bleeding. She also denies abdominal cramping or contractions.  She denies HA or visual disturbances. Contractions: Not present. Vag. Bleeding: None.  Movement: Present.  Reviewed past medical,surgical, social, obstetrical and family history as well as problem list, medications and allergies.  Objective   Vitals:   01/18/21 1109 01/18/21 1145  BP: (!) 129/91 137/90  Pulse: (!) 102 (!) 106  Weight: 216 lb (98 kg)   Body mass index is 33.83 kg/m.  Total Weight Gain:52 lb (23.6 kg)       Physical Examination:   General appearance: Well appearing, and in no distress  Mental status: Alert, oriented to person, place, and time  Skin: Warm & dry  Cardiovascular: Normal heart rate noted  Respiratory: Normal respiratory effort, no distress  Abdomen: Soft, gravid, nontender, AGA with  Fundal Height: 37  cm  Pelvic: Cervical exam performed  Dilation: Closed Effacement (%): 0 Station: Ballotable Presentation: Undeterminable  Extremities: Edema: None  Fetal Status:    Movement: Present   No results found for this or any previous visit (from the past 24 hour(s)).  Assessment & Plan:  Low-risk pregnancy of a 20 y.o., G1P0 at [redacted]w[redacted]d with an Estimated Date of Delivery: 02/11/21   1. Supervision of normal first pregnancy, antepartum -Anticipatory guidance for upcoming appts. -Patient to next appt in 1 weeks for an in-person visit. -Labs as below. -Educated on GBS bacteria including what it is, why we test, and how and when we treat if needed. -Discussed releasing of results to mychart. -Discussed and reviewed postpartum planning including contraception, pediatricians, and infant feedings and circumcision desires/costs. -Patient with WB class certificate, which was collected and scanned in today. -Informed that if she remains a candidate we will sign consents at next visit.   2. [redacted] weeks gestation of pregnancy -Doing well overall. -Discussed c/o wheezing with laying down. -Reassured that symptoms could be result of congestion/compression r/t nasal or bronchial inflammation. -Encouraged to restart allergy medications daily. -Instructed to report any worsening of symptoms.   3. Elevated blood pressure reading -Informed that blood pressures today are elevated. -Discussed potential diagnosis of GHTN and IOL after 37 weeks. -Reviewed previous blood pressures and one elevation noted at 30.4wks of 140/81 with repeat of 114/71. -Will collect PreEclampsia labs today. -Cautioned that if blood pressures are elevated next week official GHTN diagnosis would be given and patient would be scheduled for IOL. -Informed that if she is diagnosed with GHTN she  would not be a candidate for WB.     Meds: No orders of the defined types were placed in this encounter.  Labs/procedures today:  Lab Orders      Strep Gp B NAA     CBC     Comprehensive metabolic panel     Protein / creatinine ratio, urine   Reviewed: Preterm labor symptoms and general obstetric precautions including but not limited to vaginal bleeding, contractions, leaking of fluid and fetal movement were reviewed in detail with the patient.  All questions were answered.  Follow-up: Return in about 1 week (around 01/25/2021) for LROB.  Orders Placed This Encounter  Procedures  . Strep Gp B NAA  . CBC  . Comprehensive metabolic panel  . Protein / creatinine ratio, urine   Cherre Robins MSN, CNM 01/19/2021

## 2021-01-19 ENCOUNTER — Telehealth: Payer: Self-pay | Admitting: *Deleted

## 2021-01-19 LAB — CBC
Hematocrit: 30.2 % — ABNORMAL LOW (ref 34.0–46.6)
Hemoglobin: 10.4 g/dL — ABNORMAL LOW (ref 11.1–15.9)
MCH: 28 pg (ref 26.6–33.0)
MCHC: 34.4 g/dL (ref 31.5–35.7)
MCV: 81 fL (ref 79–97)
Platelets: 319 10*3/uL (ref 150–450)
RBC: 3.72 x10E6/uL — ABNORMAL LOW (ref 3.77–5.28)
RDW: 13.1 % (ref 11.7–15.4)
WBC: 9 10*3/uL (ref 3.4–10.8)

## 2021-01-19 LAB — COMPREHENSIVE METABOLIC PANEL
ALT: 10 IU/L (ref 0–32)
AST: 15 IU/L (ref 0–40)
Albumin/Globulin Ratio: 1.1 — ABNORMAL LOW (ref 1.2–2.2)
Albumin: 3.4 g/dL — ABNORMAL LOW (ref 3.9–5.0)
Alkaline Phosphatase: 65 IU/L (ref 42–106)
BUN/Creatinine Ratio: 5 — ABNORMAL LOW (ref 9–23)
BUN: 3 mg/dL — ABNORMAL LOW (ref 6–20)
Bilirubin Total: <0.2 mg/dL (ref 0.0–1.2)
CO2: 20 mmol/L (ref 20–29)
Calcium: 9.1 mg/dL (ref 8.7–10.2)
Chloride: 104 mmol/L (ref 96–106)
Creatinine, Ser: 0.6 mg/dL (ref 0.57–1.00)
Globulin, Total: 3 g/dL (ref 1.5–4.5)
Glucose: 82 mg/dL (ref 65–99)
Potassium: 3.6 mmol/L (ref 3.5–5.2)
Sodium: 139 mmol/L (ref 134–144)
Total Protein: 6.4 g/dL (ref 6.0–8.5)
eGFR: 132 mL/min/{1.73_m2} (ref >59)

## 2021-01-19 LAB — CERVICOVAGINAL ANCILLARY ONLY
Chlamydia: NEGATIVE
Comment: NEGATIVE
Comment: NEGATIVE
Comment: NORMAL
Neisseria Gonorrhea: NEGATIVE
Trichomonas: NEGATIVE

## 2021-01-19 LAB — PROTEIN / CREATININE RATIO, URINE
Creatinine, Urine: 52 mg/dL
Protein, Ur: 12.2 mg/dL
Protein/Creat Ratio: 235 mg/g creat — ABNORMAL HIGH (ref 0–200)

## 2021-01-19 NOTE — Telephone Encounter (Signed)
Call received from [employer] representative stating that pt had triggered an elevated BP of 145/91 today @ 0853. Pt reported having no sx of pre-e. Per chart review, pt had prenatal visit yesterday with elevated BP in office and concern for evolving GHTN.

## 2021-01-20 ENCOUNTER — Other Ambulatory Visit: Payer: Self-pay

## 2021-01-20 ENCOUNTER — Inpatient Hospital Stay (HOSPITAL_COMMUNITY)
Admission: AD | Admit: 2021-01-20 | Discharge: 2021-01-24 | DRG: 807 | Disposition: A | Discharge status: Home / Self Care | Payer: Medicaid Other | Attending: Family Medicine | Admitting: Family Medicine

## 2021-01-20 ENCOUNTER — Telehealth: Payer: Self-pay | Admitting: Obstetrics & Gynecology

## 2021-01-20 DIAGNOSIS — Z2839 Other underimmunization status: Secondary | ICD-10-CM

## 2021-01-20 DIAGNOSIS — O99019 Anemia complicating pregnancy, unspecified trimester: Secondary | ICD-10-CM

## 2021-01-20 DIAGNOSIS — Z283 Underimmunization status: Principal | ICD-10-CM

## 2021-01-20 DIAGNOSIS — O139 Gestational [pregnancy-induced] hypertension without significant proteinuria, unspecified trimester: Secondary | ICD-10-CM | POA: Diagnosis present

## 2021-01-20 DIAGNOSIS — O09899 Supervision of other high risk pregnancies, unspecified trimester: Secondary | ICD-10-CM

## 2021-01-20 DIAGNOSIS — O134 Gestational [pregnancy-induced] hypertension without significant proteinuria, complicating childbirth: Principal | ICD-10-CM | POA: Diagnosis present

## 2021-01-20 DIAGNOSIS — Z3A37 37 weeks gestation of pregnancy: Secondary | ICD-10-CM

## 2021-01-20 DIAGNOSIS — O99891 Other specified diseases and conditions complicating pregnancy: Secondary | ICD-10-CM

## 2021-01-20 DIAGNOSIS — D563 Thalassemia minor: Secondary | ICD-10-CM | POA: Diagnosis present

## 2021-01-20 DIAGNOSIS — O133 Gestational [pregnancy-induced] hypertension without significant proteinuria, third trimester: Secondary | ICD-10-CM | POA: Diagnosis present

## 2021-01-20 LAB — STREP GP B NAA: Strep Gp B NAA: NEGATIVE

## 2021-01-20 NOTE — Telephone Encounter (Signed)
Message from [employer]- pt notes elevated BP 160/90 and headache.  Advised to come in to hospital for further evaluation.

## 2021-01-21 ENCOUNTER — Encounter (HOSPITAL_COMMUNITY): Payer: Self-pay | Admitting: Obstetrics & Gynecology

## 2021-01-21 ENCOUNTER — Inpatient Hospital Stay (HOSPITAL_COMMUNITY): Payer: Medicaid Other | Admitting: Anesthesiology

## 2021-01-21 DIAGNOSIS — O134 Gestational [pregnancy-induced] hypertension without significant proteinuria, complicating childbirth: Secondary | ICD-10-CM | POA: Diagnosis present

## 2021-01-21 DIAGNOSIS — O133 Gestational [pregnancy-induced] hypertension without significant proteinuria, third trimester: Secondary | ICD-10-CM | POA: Diagnosis present

## 2021-01-21 DIAGNOSIS — O139 Gestational [pregnancy-induced] hypertension without significant proteinuria, unspecified trimester: Secondary | ICD-10-CM | POA: Diagnosis present

## 2021-01-21 DIAGNOSIS — D563 Thalassemia minor: Secondary | ICD-10-CM | POA: Diagnosis present

## 2021-01-21 DIAGNOSIS — R030 Elevated blood-pressure reading, without diagnosis of hypertension: Secondary | ICD-10-CM | POA: Diagnosis present

## 2021-01-21 DIAGNOSIS — Z3A37 37 weeks gestation of pregnancy: Secondary | ICD-10-CM | POA: Diagnosis not present

## 2021-01-21 LAB — COMPREHENSIVE METABOLIC PANEL
ALT: 12 U/L (ref 0–44)
AST: 16 U/L (ref 15–41)
Albumin: 2.8 g/dL — ABNORMAL LOW (ref 3.5–5.0)
Alkaline Phosphatase: 51 U/L (ref 38–126)
Anion gap: 8 (ref 5–15)
BUN: 5 mg/dL — ABNORMAL LOW (ref 6–20)
CO2: 23 mmol/L (ref 22–32)
Calcium: 9.1 mg/dL (ref 8.9–10.3)
Chloride: 104 mmol/L (ref 98–111)
Creatinine, Ser: 0.69 mg/dL (ref 0.44–1.00)
GFR, Estimated: >60 mL/min (ref >60)
Glucose, Bld: 83 mg/dL (ref 70–99)
Potassium: 3.5 mmol/L (ref 3.5–5.1)
Sodium: 135 mmol/L (ref 135–145)
Total Bilirubin: 0.6 mg/dL (ref 0.3–1.2)
Total Protein: 6.8 g/dL (ref 6.5–8.1)

## 2021-01-21 LAB — PROTEIN / CREATININE RATIO, URINE
Creatinine, Urine: 151.95 mg/dL
Protein Creatinine Ratio: 0.14 mg/mg{Cre} (ref 0.00–0.15)
Total Protein, Urine: 22 mg/dL

## 2021-01-21 LAB — CBC
HCT: 31.1 % — ABNORMAL LOW (ref 36.0–46.0)
HCT: 31.4 % — ABNORMAL LOW (ref 36.0–46.0)
Hemoglobin: 10.3 g/dL — ABNORMAL LOW (ref 12.0–15.0)
Hemoglobin: 10.5 g/dL — ABNORMAL LOW (ref 12.0–15.0)
MCH: 27.8 pg (ref 26.0–34.0)
MCH: 27.9 pg (ref 26.0–34.0)
MCHC: 33.1 g/dL (ref 30.0–36.0)
MCHC: 33.4 g/dL (ref 30.0–36.0)
MCV: 83.3 fL (ref 80.0–100.0)
MCV: 83.8 fL (ref 80.0–100.0)
Platelets: 279 10*3/uL (ref 150–400)
Platelets: 327 10*3/uL (ref 150–400)
RBC: 3.71 MIL/uL — ABNORMAL LOW (ref 3.87–5.11)
RBC: 3.77 MIL/uL — ABNORMAL LOW (ref 3.87–5.11)
RDW: 13.8 % (ref 11.5–15.5)
RDW: 13.8 % (ref 11.5–15.5)
WBC: 11.1 10*3/uL — ABNORMAL HIGH (ref 4.0–10.5)
WBC: 11.7 10*3/uL — ABNORMAL HIGH (ref 4.0–10.5)
nRBC: 0 % (ref 0.0–0.2)
nRBC: 0 % (ref 0.0–0.2)

## 2021-01-21 LAB — RPR: RPR Ser Ql: NONREACTIVE

## 2021-01-21 LAB — TYPE AND SCREEN
ABO/RH(D): O POS
Antibody Screen: NEGATIVE

## 2021-01-21 MED ORDER — EPHEDRINE 5 MG/ML INJ: 10.0000 mg | INTRAVENOUS | Status: DC | PRN

## 2021-01-21 MED ORDER — TERBUTALINE SULFATE 1 MG/ML IJ SOLN: 0.2500 mg | Freq: Once | INTRAMUSCULAR | Status: DC | PRN

## 2021-01-21 MED ORDER — ACETAMINOPHEN 500 MG PO TABS
1000.0000 mg | ORAL_TABLET | Freq: Once | ORAL | Status: AC
  Administered 2021-01-21: 1000 mg via ORAL
  Filled 2021-01-21: qty 2

## 2021-01-21 MED ORDER — PHENYLEPHRINE 40 MCG/ML (10ML) SYRINGE FOR IV PUSH (FOR BLOOD PRESSURE SUPPORT)
80.0000 ug | PREFILLED_SYRINGE | INTRAVENOUS | Status: DC | PRN
  Filled 2021-01-21: qty 10

## 2021-01-21 MED ORDER — DIPHENHYDRAMINE HCL 50 MG/ML IJ SOLN: 12.5000 mg | INTRAMUSCULAR | Status: DC | PRN

## 2021-01-21 MED ORDER — LACTATED RINGERS IV SOLN: 500.0000 mL | INTRAVENOUS | Status: DC | PRN

## 2021-01-21 MED ORDER — PHENYLEPHRINE 40 MCG/ML (10ML) SYRINGE FOR IV PUSH (FOR BLOOD PRESSURE SUPPORT): 80.0000 ug | PREFILLED_SYRINGE | INTRAVENOUS | Status: DC | PRN

## 2021-01-21 MED ORDER — LIDOCAINE HCL (PF) 1 % IJ SOLN: 30.0000 mL | INTRAMUSCULAR | Status: DC | PRN

## 2021-01-21 MED ORDER — LIDOCAINE-EPINEPHRINE (PF) 2 %-1:200000 IJ SOLN
INTRAMUSCULAR | Status: DC | PRN
  Administered 2021-01-21: 4 mL via EPIDURAL

## 2021-01-21 MED ORDER — ONDANSETRON HCL 4 MG/2ML IJ SOLN
4.0000 mg | Freq: Four times a day (QID) | INTRAMUSCULAR | Status: DC | PRN
  Administered 2021-01-21: 4 mg via INTRAVENOUS
  Filled 2021-01-21: qty 2

## 2021-01-21 MED ORDER — OXYTOCIN-SODIUM CHLORIDE 30-0.9 UT/500ML-% IV SOLN: 2.5000 [IU]/h | INTRAVENOUS | Status: DC

## 2021-01-21 MED ORDER — MISOPROSTOL 25 MCG QUARTER TABLET
25.0000 ug | ORAL_TABLET | ORAL | Status: DC | PRN
  Administered 2021-01-21: 25 ug via VAGINAL
  Filled 2021-01-21: qty 1

## 2021-01-21 MED ORDER — FENTANYL CITRATE (PF) 100 MCG/2ML IJ SOLN
100.0000 ug | INTRAMUSCULAR | Status: DC | PRN
Start: 2021-01-21 — End: 2021-01-22
  Administered 2021-01-21 (×2): 100 ug via INTRAVENOUS
  Filled 2021-01-21 (×2): qty 2

## 2021-01-21 MED ORDER — LACTATED RINGERS IV SOLN
500.0000 mL | Freq: Once | INTRAVENOUS | Status: AC
  Administered 2021-01-21: 500 mL via INTRAVENOUS

## 2021-01-21 MED ORDER — OXYTOCIN BOLUS FROM INFUSION
333.0000 mL | Freq: Once | INTRAVENOUS | Status: AC
  Administered 2021-01-22: 333 mL via INTRAVENOUS

## 2021-01-21 MED ORDER — OXYTOCIN-SODIUM CHLORIDE 30-0.9 UT/500ML-% IV SOLN
1.0000 m[IU]/min | INTRAVENOUS | Status: DC
  Administered 2021-01-21: 2 m[IU]/min via INTRAVENOUS
  Filled 2021-01-21: qty 500

## 2021-01-21 MED ORDER — FENTANYL-BUPIVACAINE-NACL 0.5-0.125-0.9 MG/250ML-% EP SOLN
12.0000 mL/h | EPIDURAL | Status: DC | PRN
Start: 2021-01-21 — End: 2021-01-22
  Administered 2021-01-21: 12 mL/h via EPIDURAL
  Filled 2021-01-21: qty 250

## 2021-01-21 MED ORDER — SOD CITRATE-CITRIC ACID 500-334 MG/5ML PO SOLN: 30.0000 mL | ORAL | Status: DC | PRN

## 2021-01-21 MED ORDER — ACETAMINOPHEN 325 MG PO TABS: 650.0000 mg | ORAL_TABLET | ORAL | Status: DC | PRN

## 2021-01-21 MED ORDER — LACTATED RINGERS IV SOLN: INTRAVENOUS | Status: DC

## 2021-01-21 NOTE — Anesthesia Procedure Notes (Signed)
Epidural Patient location during procedure: OB Start time: 01/21/2021 8:10 PM End time: 01/21/2021 8:20 PM  Staffing Anesthesiologist: Elmer Picker, MD Performed: anesthesiologist   Preanesthetic Checklist Completed: patient identified, IV checked, risks and benefits discussed, monitors and equipment checked, pre-op evaluation and timeout performed  Epidural Patient position: sitting Prep: DuraPrep and site prepped and draped Patient monitoring: continuous pulse ox, blood pressure, heart rate and cardiac monitor Approach: midline Location: L3-L4 Injection technique: LOR air  Needle:  Needle type: Tuohy  Needle gauge: 17 G Needle length: 9 cm Needle insertion depth: 6 cm Catheter type: closed end flexible Catheter size: 19 Gauge Catheter at skin depth: 11 cm Test dose: negative  Assessment Sensory level: T8 Events: blood not aspirated, injection not painful, no injection resistance, no paresthesia and negative IV test  Additional Notes Patient identified. Risks/Benefits/Options discussed with patient including but not limited to bleeding, infection, nerve damage, paralysis, failed block, incomplete pain control, headache, blood pressure changes, nausea, vomiting, reactions to medication both or allergic, itching and postpartum back pain. Confirmed with bedside nurse the patient's most recent platelet count. Confirmed with patient that they are not currently taking any anticoagulation, have any bleeding history or any family history of bleeding disorders. Patient expressed understanding and wished to proceed. All questions were answered. Sterile technique was used throughout the entire procedure. Please see nursing notes for vital signs. Test dose was given through epidural catheter and negative prior to continuing to dose epidural or start infusion. Warning signs of high block given to the patient including shortness of breath, tingling/numbness in hands, complete motor block,  or any concerning symptoms with instructions to call for help. Patient was given instructions on fall risk and not to get out of bed. All questions and concerns addressed with instructions to call with any issues or inadequate analgesia.  Reason for block:procedure for pain

## 2021-01-21 NOTE — H&P (Addendum)
LABOR AND DELIVERY ADMISSION HISTORY AND PHYSICAL NOTE  Kari Dunn is a 20 y.o. female G1P0 with IUP at 81w0dby LMP presenting to MAU for elevated BP. Patient reports having a HA this evening was initially 9/10- did not take any medication for HA. Took BP at home which was 160/97. She presented to MAU for further evaluation. Patient had elevated BP in office on 3/17. PEC labs negative in MAU but BP continues to be elevated and patient diagnosed with GHTN on 3/20.   After treatment with Tylenol in MAU patient reports HA is completely resolved.   She reports positive fetal movement. She denies leakage of fluid or vaginal bleeding.  [place] [facility] Pregnancy complications:  - Gestational hypertension  - Rubella non-immune status, antepartum  - Alpha thalassemia silent carrier   Past Medical History: Past Medical History:  Diagnosis Date   Anemia     Past Surgical History: Past Surgical History:  Procedure Laterality Date   broken arm  2013   NO PAST SURGERIES      Obstetrical History: OB History     Gravida  1   Para      Term      Preterm      AB      Living  0      SAB      IAB      Ectopic      Multiple      Live Births              Social History: Social History   Socioeconomic History   Marital status: Single    Spouse name: Not on file   Number of children: Not on file   Years of education: Not on file   Highest education level: Not on file  Occupational History   Not on file  Tobacco Use   Smoking status: Never Smoker   Smokeless tobacco: Never Used  Vaping Use   Vaping Use: Former  Substance and Sexual Activity   Alcohol use: Not Currently   Drug use: Not Currently    Types: Marijuana    Comment: stopped when she found out about pregnancy   Sexual activity: Yes  Other Topics Concern   Not on file  Social History Narrative   Not on file   Social Determinants of Health   Financial Resource  Strain: Not on file  Food Insecurity: Not on file  Transportation Needs: Not on file  Physical Activity: Not on file  Stress: Not on file  Social Connections: Not on file    Family History: Family History  Problem Relation Age of Onset   Hypertension Mother    Diabetes Mother    Diabetes Sister    Diabetes Maternal Aunt     Allergies: No Known Allergies  Medications Prior to Admission  Medication Sig Dispense Refill Last Dose   ferrous sulfate 325 (65 FE) MG tablet Take 1 tablet (325 mg total) by mouth 2 (two) times daily with a meal. 60 tablet 5 01/20/2021 at Unknown time   Prenat-Fe Poly-Methfol-FA-DHA (VITAFOL ULTRA) 29-0.6-0.4-200 MG CAPS Take 1 tablet by mouth daily. 30 capsule 12 01/20/2021 at Unknown time   Blood Pressure Monitoring (BLOOD PRESSURE KIT) DEVI 1 kit by Does not apply route as needed. 1 each 0    docusate sodium (COLACE) 100 MG capsule Take 100 mg by mouth 2 (two) times daily. (Patient not taking: No sig reported)  guaiFENesin (MUCINEX) 600 MG 12 hr tablet Take 1 tablet (600 mg total) by mouth 2 (two) times daily. (Patient not taking: No sig reported) 60 tablet 11    Misc. Devices (BREAST PUMP) MISC Dispense one breast pump for patient (Patient not taking: No sig reported) 1 each 0    pantoprazole (PROTONIX) 20 MG tablet Take 1 tablet (20 mg total) by mouth daily. (Patient not taking: No sig reported) 30 tablet 0     Review of Systems  All systems reviewed and negative except as stated in HPI  Physical Exam Blood pressure 125/89, pulse 97, temperature 98 F (36.7 C), resp. rate 18, height _0  (1.702 m), weight 98 kg, last menstrual period 05/07/2020, SpO2 100 %. General appearance: alert, cooperative and no distress Lungs: clear to auscultation bilaterally Heart: regular rate and rhythm Abdomen: soft, non-tender; bowel sounds normal Extremities: No calf swelling or tenderness Presentation: cephalic by bedside ultrasound  Fetal monitoring:  130/moderate/+accels/ no decelerations  Uterine activity: irregular mild contractions   +2 DTR, no clonus   Prenatal labs: ABO, Rh: O/Positive/-- (11/11 1153) Antibody: Negative (11/11 1153) Rubella: <0.90 (11/11 1153) RPR: Non Reactive (01/20 1104)  HBsAg: Negative (11/11 1153)  HIV: Non Reactive (01/20 1104)  GC/Chlamydia: Negative  GBS: Negative/-- (03/17 1156)  2 hr Glucola: 82-124-99 Genetic screening:  Low risk female  Anatomy US: normal    Nursing Staff Provider  Office Location  [place] Dating  LMP  Language  English Anatomy US  10/04/20 f/u scheduled 11/08/20 - normal  Flu Vaccine  09-14-20 Genetic Screen  NIPS: low risk female AFP: not performed  TDaP Vaccine   01/03/2021 Hgb A1C or  GTT Early  Third trimester  Glucose, Fasting 65 - 91 mg/dL 82   Glucose, 1 hour 65 - 179 mg/dL 124   Glucose, 2 hour 65 - 152 mg/dL 99     COVID Vaccine Not vaccinated   LAB RESULTS   Rhogam  O+ Blood Type O/Positive/-- (11/11 1153)   Feeding Plan Breast Antibody Negative (11/11 1153)  Contraception Undecided, info given Rubella <0.90 (11/11 1153)NON IMMUNE  Circumcision Yes if boy RPR Non Reactive (01/20 1104)   Pediatrician  Undecided, info given HBsAg Negative (11/11 1153)   Support Person sister HCVAb Negative  Prenatal Classes no HIV Non Reactive (01/20 1104)     BTL Consent  GBS   (For PCN allergy, check sensitivities)   VBAC Consent  Pap Age 4  Waterbirth Consent  Hgb Electro  aa  BP Cuff Ordered  CF neg    SMA 2 copies    Waterbirth  _1  Class _2  Consent _3  CNM visit    Induction  _4  Orders Entered _5 Foley Y/N    Prenatal Transfer Tool  Maternal Diabetes: No Genetic Screening: Normal Maternal Ultrasounds/Referrals: Normal Fetal Ultrasounds or other Referrals:  None Maternal Substance Abuse:  No Significant Maternal Medications:  None Significant Maternal Lab Results: Group B Strep negative  Results for orders placed or performed during the hospital encounter of  01/20/21 (from the past 24 hour(s))  Protein / creatinine ratio, urine   Collection Time: 01/20/21 11:29 PM  Result Value Ref Range   Creatinine, Urine 151.95 mg/dL   Total Protein, Urine 22 mg/dL   Protein Creatinine Ratio 0.14 0.00 - 0.15 mg/mg[Cre]  CBC   Collection Time: 01/20/21 11:52 PM  Result Value Ref Range   WBC 11.7 (H) 4.0 - 10.5 K/uL   RBC 3.77 (L) 3.87 -  5.11 MIL/uL   Hemoglobin 10.5 (L) 12.0 - 15.0 g/dL   HCT 31.4 (L) 36.0 - 46.0 %   MCV 83.3 80.0 - 100.0 fL   MCH 27.9 26.0 - 34.0 pg   MCHC 33.4 30.0 - 36.0 g/dL   RDW 13.8 11.5 - 15.5 %   Platelets 327 150 - 400 K/uL   nRBC 0.0 0.0 - 0.2 %  Comprehensive metabolic panel   Collection Time: 01/20/21 11:52 PM  Result Value Ref Range   Sodium 135 135 - 145 mmol/L   Potassium 3.5 3.5 - 5.1 mmol/L   Chloride 104 98 - 111 mmol/L   CO2 23 22 - 32 mmol/L   Glucose, Bld 83 70 - 99 mg/dL   BUN 5 (L) 6 - 20 mg/dL   Creatinine, Ser 0.69 0.44 - 1.00 mg/dL   Calcium 9.1 8.9 - 10.3 mg/dL   Total Protein 6.8 6.5 - 8.1 g/dL   Albumin 2.8 (L) 3.5 - 5.0 g/dL   AST 16 15 - 41 U/L   ALT 12 0 - 44 U/L   Alkaline Phosphatase 51 38 - 126 U/L   Total Bilirubin 0.6 0.3 - 1.2 mg/dL   GFR, Estimated >60 >60 mL/min   Anion gap 8 5 - 15    Patient Active Problem List   Diagnosis Date Noted   Rubella non-immune status, antepartum 01/03/2021   Alpha thalassemia silent carrier 10/12/2020   Supervision of normal first pregnancy, antepartum 09/14/2020    Assessment: Kari Dunn is a 20 y.o. G1P0 at 28w0dhere for IOL for GHTN   #Labor: IOL with cytotec. FB when possible  #Pain: Plans natural labor and delivery. Patient reports epidural only if needed #FWB: Cat I  #ID:  GBS neg  #MOF: Breast  #MOC:Unsure at this time  #Circ:  Yes, inpatient   RLajean Manes CNM 01/21/2021, 2:46 AM  Attestation of Attending Supervision of Advanced Practice Provider (PA/CNM/NP): Evaluation and management procedures were performed by the  Advanced Practice Provider under my supervision and collaboration.  I have reviewed the Advanced Practice Provider's note and chart, and I agree with the management and plan. I have also made any necessary editorial changes.   JAnnalee Genta DO Attending [facility] 01/21/2021 5:05 AM

## 2021-01-21 NOTE — MAU Note (Signed)
Took my b/p tonight at home and it was 160 97. Was told to come in. Have had a headache for couple hours and it is causing nausea. Has not taken anything for h/a. Denies VB or LOF. Good FM

## 2021-01-21 NOTE — Anesthesia Preprocedure Evaluation (Signed)
Anesthesia Evaluation  Patient identified by MRN, date of birth, ID band Patient awake    Reviewed: Allergy & Precautions, NPO status , Patient's Chart, lab work & pertinent test results  Airway Mallampati: III  TM Distance: >3 FB Neck ROM: Full    Dental no notable dental hx.    Pulmonary neg pulmonary ROS,    Pulmonary exam normal breath sounds clear to auscultation       Cardiovascular hypertension (gHTN), Normal cardiovascular exam Rhythm:Regular Rate:Normal     Neuro/Psych negative neurological ROS  negative psych ROS   GI/Hepatic negative GI ROS, Neg liver ROS,   Endo/Other  negative endocrine ROS  Renal/GU negative Renal ROS  negative genitourinary   Musculoskeletal negative musculoskeletal ROS (+)   Abdominal   Peds  Hematology negative hematology ROS (+)   Anesthesia Other Findings IOL for gHTN  Reproductive/Obstetrics (+) Pregnancy                             Anesthesia Physical Anesthesia Plan  ASA: III  Anesthesia Plan: Epidural   Post-op Pain Management:    Induction:   PONV Risk Score and Plan: Treatment may vary due to age or medical condition  Airway Management Planned: Natural Airway  Additional Equipment:   Intra-op Plan:   Post-operative Plan:   Informed Consent: I have reviewed the patients History and Physical, chart, labs and discussed the procedure including the risks, benefits and alternatives for the proposed anesthesia with the patient or authorized representative who has indicated his/her understanding and acceptance.       Plan Discussed with: Anesthesiologist  Anesthesia Plan Comments: (Patient identified. Risks, benefits, options discussed with patient including but not limited to bleeding, infection, nerve damage, paralysis, failed block, incomplete pain control, headache, blood pressure changes, nausea, vomiting, reactions to medication,  itching, and post partum back pain. Confirmed with bedside nurse the patient's most recent platelet count. Confirmed with the patient that they are not taking any anticoagulation, have any bleeding history or any family history of bleeding disorders. Patient expressed understanding and wishes to proceed. All questions were answered. )        Anesthesia Quick Evaluation

## 2021-01-21 NOTE — Progress Notes (Signed)
Pt requesting to be able to use ball and walk around in room.  Telemetry monitors removed

## 2021-01-21 NOTE — Progress Notes (Signed)
Kari Dunn is a 20 y.o. G1P0 at [redacted]w[redacted]d by LMP admitted for induction of labor due to Hypertension.  Subjective: Patient in bed, significant other at bedside for support. Expressed feeling nauseous, post epidural placement. Pain controled with the epidural.  Denies difficulty breathing, respiratory distress, chest pain, headache or visual disturbances, and epigastric pain.  Objective: BP 115/64   Pulse (!) 102   Temp (!) 97.5 F (36.4 C) (Oral)   Resp 16   Ht 5\' 7"  (1.702 m)   Wt 98 kg   LMP 05/07/2020   SpO2 99%   BMI 33.83 kg/m  No intake/output data recorded. No intake/output data recorded.  FHT:  FHR: 140 bpm, variability: moderate,  accelerations:  Present,  decelerations:  Absent UC:   regular, every 2 minutes SVE:   Dilation: 5 Effacement (%): 70 Station: -2 Exam by:: [email or web address], RN  Labs: Lab Results  Component Value Date   WBC 11.1 (H) 01/21/2021   HGB 10.3 (L) 01/21/2021   HCT 31.1 (L) 01/21/2021   MCV 83.8 01/21/2021   PLT 279 01/21/2021    Assessment / Plan: Induction of labor due to gestational hypertension,  progressing well on pitocin  Labor: Progressing on Pitocin, will continue to increase then AROM  Zofran given by nurse Preeclampsia:  no signs or symptoms of toxicity and labs stable Fetal Wellbeing:  Category I Pain Control:  Epidural I/D:  n/a Anticipated MOD:  NSVD  01/23/2021, SNM Juliann Pares 01/21/2021, 9:34 PM

## 2021-01-21 NOTE — Progress Notes (Signed)
Telemetry monitors applied and pt on birthing ball

## 2021-01-22 ENCOUNTER — Encounter (HOSPITAL_COMMUNITY): Payer: Self-pay | Admitting: Obstetrics & Gynecology

## 2021-01-22 DIAGNOSIS — Z3A37 37 weeks gestation of pregnancy: Secondary | ICD-10-CM

## 2021-01-22 DIAGNOSIS — O134 Gestational [pregnancy-induced] hypertension without significant proteinuria, complicating childbirth: Secondary | ICD-10-CM

## 2021-01-22 LAB — CBC
HCT: 28.5 % — ABNORMAL LOW (ref 36.0–46.0)
Hemoglobin: 9.8 g/dL — ABNORMAL LOW (ref 12.0–15.0)
MCH: 28.5 pg (ref 26.0–34.0)
MCHC: 34.4 g/dL (ref 30.0–36.0)
MCV: 82.8 fL (ref 80.0–100.0)
Platelets: 259 10*3/uL (ref 150–400)
RBC: 3.44 MIL/uL — ABNORMAL LOW (ref 3.87–5.11)
RDW: 13.8 % (ref 11.5–15.5)
WBC: 12.6 10*3/uL — ABNORMAL HIGH (ref 4.0–10.5)
nRBC: 0 % (ref 0.0–0.2)

## 2021-01-22 MED ORDER — MEASLES, MUMPS & RUBELLA VAC IJ SOLR: 0.5000 mL | Freq: Once | INTRAMUSCULAR | Status: DC

## 2021-01-22 MED ORDER — ACETAMINOPHEN 325 MG PO TABS: 650.0000 mg | ORAL_TABLET | ORAL | Status: DC | PRN

## 2021-01-22 MED ORDER — DIPHENHYDRAMINE HCL 25 MG PO CAPS: 25.0000 mg | ORAL_CAPSULE | Freq: Four times a day (QID) | ORAL | Status: DC | PRN

## 2021-01-22 MED ORDER — BENZOCAINE-MENTHOL 20-0.5 % EX AERO: 1.0000 "application " | INHALATION_SPRAY | CUTANEOUS | Status: DC | PRN

## 2021-01-22 MED ORDER — ONDANSETRON HCL 4 MG PO TABS: 4.0000 mg | ORAL_TABLET | ORAL | Status: DC | PRN

## 2021-01-22 MED ORDER — PRENATAL MULTIVITAMIN CH
1.0000 | ORAL_TABLET | Freq: Every day | ORAL | Status: DC
  Administered 2021-01-22 – 2021-01-24 (×3): 1 via ORAL
  Filled 2021-01-22 (×3): qty 1

## 2021-01-22 MED ORDER — ONDANSETRON HCL 4 MG/2ML IJ SOLN: 4.0000 mg | INTRAMUSCULAR | Status: DC | PRN

## 2021-01-22 MED ORDER — ZOLPIDEM TARTRATE 5 MG PO TABS: 5.0000 mg | ORAL_TABLET | Freq: Every evening | ORAL | Status: DC | PRN

## 2021-01-22 MED ORDER — IBUPROFEN 600 MG PO TABS
600.0000 mg | ORAL_TABLET | Freq: Four times a day (QID) | ORAL | Status: DC
  Administered 2021-01-22 – 2021-01-24 (×9): 600 mg via ORAL
  Filled 2021-01-22 (×9): qty 1

## 2021-01-22 MED ORDER — SENNOSIDES-DOCUSATE SODIUM 8.6-50 MG PO TABS
2.0000 | ORAL_TABLET | ORAL | Status: DC
  Administered 2021-01-22 – 2021-01-24 (×3): 2 via ORAL
  Filled 2021-01-22 (×3): qty 2

## 2021-01-22 MED ORDER — COCONUT OIL OIL: 1.0000 "application " | TOPICAL_OIL | Status: DC | PRN

## 2021-01-22 MED ORDER — WITCH HAZEL-GLYCERIN EX PADS: 1.0000 "application " | MEDICATED_PAD | CUTANEOUS | Status: DC | PRN

## 2021-01-22 MED ORDER — DIBUCAINE (PERIANAL) 1 % EX OINT: 1.0000 "application " | TOPICAL_OINTMENT | CUTANEOUS | Status: DC | PRN

## 2021-01-22 MED ORDER — SIMETHICONE 80 MG PO CHEW: 80.0000 mg | CHEWABLE_TABLET | ORAL | Status: DC | PRN

## 2021-01-22 MED ORDER — TETANUS-DIPHTH-ACELL PERTUSSIS 5-2.5-18.5 LF-MCG/0.5 IM SUSY: 0.5000 mL | PREFILLED_SYRINGE | Freq: Once | INTRAMUSCULAR | Status: DC

## 2021-01-22 NOTE — Progress Notes (Signed)
Zandra Lajeunesse is a 20 y.o. G1P0 at [redacted]w[redacted]d by LMP admitted for induction of labor due to Hypertension.  Subjective: Patient resting comfortable in bed, significant other at bedside for support. No complaints.  Objective: BP 119/64   Pulse 99   Temp 98.1 F (36.7 C) (Oral)   Resp 18   Ht 5\' 7"  (1.702 m)   Wt 98 kg   LMP 05/07/2020   SpO2 99%   BMI 33.83 kg/m  No intake/output data recorded. No intake/output data recorded.  FHT:  FHR: 140 bpm, variability: moderate,  accelerations:  Present,  decelerations:  Absent UC:   regular, every 2 minutes SVE:   Dilation: 6 Effacement (%): 70,80 Station: -2 Exam by:: [email or web address], CNM   AROM- Clear fluid, moderate amount, normal odor. IUPC in place  Labs: Lab Results  Component Value Date   WBC 11.1 (H) 01/21/2021   HGB 10.3 (L) 01/21/2021   HCT 31.1 (L) 01/21/2021   MCV 83.8 01/21/2021   PLT 279 01/21/2021    Assessment / Plan: Induction of labor due to gestational hypertension,  progressing well on pitocin  Labor: Progressing on pitocin, will continue to increase per protocol. IUPC in place. Preeclampsia:  no signs or symptoms of toxicity Fetal Wellbeing:  Category I Pain Control:  Epidural I/D:  n/a Anticipated MOD:  NSVD   Lauren 01/23/2021, Student-MidWife  Wynonia Hazard 01/22/2021, 12:29 AM

## 2021-01-22 NOTE — Lactation Note (Signed)
This note was copied from a baby's chart. Lactation Consultation Note  Patient Name: Kari Dunn CRFVO'H Date: 01/22/2021 Reason for consult: L&D Initial assessment Age:21 hours  P1, Baby [redacted]w[redacted]d.  Had to suction baby before latching.   Attempted in cradle hold but baby sleepy at the breast with few cues. Left baby STS on mother's chest and explained we will help her more in MBU once baby transitions further.   Interventions Interventions: Assisted with latch;Skin to skin;Education  Consult Status Consult Status: Follow-up Date: 01/22/21 Follow-up type: In-patient    Dahlia Byes [facility] 01/22/2021, 8:23 AM

## 2021-01-22 NOTE — Discharge Summary (Addendum)
Postpartum Discharge Summary    Patient Name: Kari Dunn DOB: [birth year] MRN: [id number]  Date of admission: 01/20/2021 Delivery date:01/22/2021  Delivering provider: Lajean Manes  Date of discharge: 01/24/2021  Admitting diagnosis: Gestational hypertension, third trimester [O13.3] Intrauterine pregnancy: [redacted]w[redacted]d    Secondary diagnosis:  Active Problems:   Rubella non-immune status, antepartum   Gestational hypertension   Gestational hypertension, third trimester   SVD (spontaneous vaginal delivery)  Additional problems: none    Discharge diagnosis: Term Pregnancy Delivered and Gestational Hypertension                                              Post partum procedures:none Augmentation: AROM, Pitocin, Cytotec and IP Foley Complications: None  Hospital course: Induction of Labor With Vaginal Delivery   20y.o. G1P0 at 326w1das admitted to the hospital 01/20/2021 for induction of labor.  Indication for induction: Gestational hypertension.  Patient had an uncomplicated labor course as follows: Membrane Rupture Time/Date: 12:18 AM ,01/22/2021   Delivery Method:Vaginal, Spontaneous  Episiotomy: None  Lacerations:  Labial  Details of delivery can be found in separate delivery note. Patient was started on Norvasc 108mostpartum for elevated BPs. She otherwise had a routine postpartum course. Patient is discharged home 01/24/21.  Newborn Data: Birth date:01/22/2021  Birth time:7:40 AM  Gender:Female  Living status:Living  Apgars:8 ,9  Weight:3147 g   Magnesium Sulfate received: No BMZ received: No Rhophylac:N/A MMR:Yes, offered prior to discharge T-DaP:Given prenatally Flu: Yes Transfusion:No  Physical exam  Vitals:   01/23/21 1137 01/23/21 1601 01/23/21 1943 01/24/21 0544  BP: 133/81 125/74 135/86 (!) 138/91  Pulse: 79 95 74 98  Resp:  _0 Temp:  97.9 F (36.6 C) 97.7 F (36.5 C) 97.8 F (36.6 C)  TempSrc:  Oral Oral Oral  SpO2:  100% 100% 100%   Weight:      Height:       General: alert, cooperative and no distress Lochia: appropriate Uterine Fundus: firm Incision: N/A DVT Evaluation: No evidence of DVT seen on physical exam. Labs: Lab Results  Component Value Date   WBC 12.6 (H) 01/22/2021   HGB 9.8 (L) 01/22/2021   HCT 28.5 (L) 01/22/2021   MCV 82.8 01/22/2021   PLT 259 01/22/2021   CMP Latest Ref Rng & Units 01/20/2021  Glucose 70 - 99 mg/dL 83  BUN 6 - 20 mg/dL 5(L)  Creatinine 0.44 - 1.00 mg/dL 0.69  Sodium 135 - 145 mmol/L 135  Potassium 3.5 - 5.1 mmol/L 3.5  Chloride 98 - 111 mmol/L 104  CO2 22 - 32 mmol/L 23  Calcium 8.9 - 10.3 mg/dL 9.1  Total Protein 6.5 - 8.1 g/dL 6.8  Total Bilirubin 0.3 - 1.2 mg/dL 0.6  Alkaline Phos 38 - 126 U/L 51  AST 15 - 41 U/L 16  ALT 0 - 44 U/L 12   Edinburgh Score: Edinburgh Postnatal Depression Scale Screening Tool 01/23/2021  I have been able to laugh and see the funny side of things. 0  I have looked forward with enjoyment to things. 0  I have blamed myself unnecessarily when things went wrong. 1  I have been anxious or worried for no good reason. 0  I have felt scared or panicky for no good reason. 0  Things have been getting on top of me. 1  I have been so unhappy that I have had difficulty sleeping. 0  I have felt sad or miserable. 0  I have been so unhappy that I have been crying. 0  The thought of harming myself has occurred to me. 0  Edinburgh Postnatal Depression Scale Total 2     After visit meds:  Allergies as of 01/24/2021   No Known Allergies     Medication List    STOP taking these medications   Blood Pressure Kit Devi   Breast Pump Misc     TAKE these medications   acetaminophen 325 MG tablet Commonly known as: Tylenol Take 2 tablets (650 mg total) by mouth every 4 (four) hours as needed (for pain scale < 4).   amLODipine 10 MG tablet Commonly known as: NORVASC Take 1 tablet (10 mg total) by mouth daily.   coconut oil Oil Apply 1  application topically as needed.   ferrous sulfate 325 (65 FE) MG tablet Take 1 tablet (325 mg total) by mouth daily with breakfast. What changed: when to take this   ibuprofen 600 MG tablet Commonly known as: ADVIL Take 1 tablet (600 mg total) by mouth every 6 (six) hours.   Vitafol Ultra 29-0.6-0.4-200 MG Caps Take 1 tablet by mouth daily.        Discharge home in stable condition Infant Feeding: Breast Infant Disposition:home with mother Discharge instruction: per After Visit Summary and Postpartum booklet. Activity: Advance as tolerated. Pelvic rest for 6 weeks.  Diet: routine diet Future Appointments: Future Appointments  Date Time Provider [place]  01/29/2021 10:40 AM [place] None  02/19/2021  2:00 PM [place], Rona Ravens, NP [place] None   Follow up Visit:   Please schedule this patient for a In person postpartum visit in 4 weeks with the following provider: Any provider. Additional Postpartum F/U:BP check 1 week  High risk pregnancy complicated by: HTN Delivery mode:  Vaginal, Spontaneous  Anticipated Birth Control:  Undecided   01/24/2021 Alcus Dad, MD  GME ATTESTATION:  I saw and evaluated the patient. I agree with the findings and the plan of care as documented in the resident's note.  Arrie Senate, MD OB Fellow, [place] [facility] [place] 01/24/2021 8:00 AM

## 2021-01-22 NOTE — Anesthesia Postprocedure Evaluation (Signed)
Anesthesia Post Note  Patient: Science writer  Procedure(s) Performed: AN AD HOC LABOR EPIDURAL     Patient location during evaluation: Mother Baby Anesthesia Type: Epidural Level of consciousness: awake Pain management: satisfactory to patient Vital Signs Assessment: post-procedure vital signs reviewed and stable Respiratory status: spontaneous breathing Cardiovascular status: stable Anesthetic complications: no   No complications documented.  Last Vitals:  Vitals:   01/22/21 1056 01/22/21 1535  BP: 140/88 (!) 141/76  Pulse: 91 84  Resp: 18 18  Temp: 37.1 C 36.7 C  SpO2: 98% 100%    Last Pain:  Vitals:   01/22/21 1815  TempSrc:   PainSc: 2    Pain Goal: Patients Stated Pain Goal: 0 (01/21/21 0008)                 Cephus Shelling

## 2021-01-22 NOTE — Lactation Note (Signed)
This note was copied from a baby's chart. Lactation Consultation Note  Patient Name: Kari Dunn [id number]'Z Date: 01/22/2021 Reason for consult: Early term 37-38.6wks;1st time breastfeeding (Mom has flat and inverted nipples, not latched infant at breast.) Age:21 hours  Infant had one void and 2 stools since birth. Mom is open to using donor breast milk at this time. LC entered the room, mom was holding infant STS, mom did not want assistance with latching infant at the breast at this time. LC taught mom hand expression and infant was given 1 ml of colostrum by spoon, becoming alert and wanting to feed. Per mom, she was advised to do STS and she will wait to latch infant at the breast. Per mom, she attempted in L&D but not since she mostly been giving infant a few drops of colostrum by finger. LC observed mom has flat nipples that are inverted. LC gave mom, breast shells to wear during the day in bra and explained how to use and fitted mom with 20 mm NS. Mom was given hand pump to pre-pump breast prior to latching infant at the breast. Infant took 7 mls of colostrum and 1 ml of mom's EBM by curve tip syringe on LC's gloved finger. Mom has used DEBP 3 times today. Mom knows to call RN or LC when she is ready for assistance with latching infant at the breast. Mom's plan: -1 Breastfeed infant according to hunger cues, 8 to 12+ times within 24 hours, STS. -2 Pre-pump with hand pump then apply 20 mm NS prior to latching infant at the breast. -3 After latching infant at the breast supplement infant with donor breast milk according to age/ hours of life. -4 Continue to use DEBP and give infant any of Mom's EBM first before offering donor breast milk. -5 Wear breast shells in bra during the day and not at night.   Maternal Data Has patient been taught Hand Expression?: Yes Does the patient have breastfeeding experience prior to this delivery?: No  Feeding Mother's Current Feeding Choice:  Breast Milk and Donor Milk  LATCH Score                    Lactation Tools Discussed/Used Tools: Pump Breast pump type: Double-Electric Breast Pump;Manual Pump Education: Setup, frequency, and cleaning;Milk Storage Reason for Pumping: Mom has pumpped 3 times to help establish milk supply, infant has not latched at the breast. Pumping frequency: Every 3 hours for 15 minutes on inital setting.  Interventions Interventions: Breast feeding basics reviewed;Hand express;Pre-pump if needed;Shells;Expressed milk;DEBP;Hand pump;Skin to skin  Discharge Pump: DEBP;Manual;Personal WIC Program: Yes  Consult Status Consult Status: Follow-up Date: 01/23/21 Follow-up type: In-patient    [name] 01/22/2021, 10:39 PM

## 2021-01-22 NOTE — Progress Notes (Signed)
Kari Dunn is a 20 y.o. G1P0 at [redacted]w[redacted]d by LMP admitted for induction of labor due to Hypertension.  Subjective:   Objective: BP 110/70   Pulse (!) 108   Temp (!) 97.5 F (36.4 C) (Oral)   Resp 17   Ht 5\' 7"  (1.702 m)   Wt 98 kg   LMP 05/07/2020   SpO2 99%   BMI 33.83 kg/m  No intake/output data recorded. Total I/O In: -  Out: 725 [Urine:725]  FHT:  FHR: 150 bpm, variability: moderate,  accelerations:  Present,  decelerations:  Absent UC:   regular, every 2-2.5 minutes SVE:   Dilation: Lip/rim Effacement (%): 90 Station: 0 Exam by:: [email or web address], RN/Lauren Mickle Mallory, student nurse midwife  170-200 MVUs  Labs: Lab Results  Component Value Date   WBC 11.1 (H) 01/21/2021   HGB 10.3 (L) 01/21/2021   HCT 31.1 (L) 01/21/2021   MCV 83.8 01/21/2021   PLT 279 01/21/2021    Assessment / Plan: Induction of labor due to gestational hypertension,  progressing well on pitocin  Labor: Progressing on pitocin.Patient is in High fowlers position, laboring down. Preeclampsia:  no signs or symptoms of toxicity Fetal Wellbeing:  Category I Pain Control:  Epidural I/D:  n/a Anticipated MOD:  NSVD  01/23/2021 [employer] 01/22/2021, 4:59 AM

## 2021-01-23 MED ORDER — AMLODIPINE BESYLATE 5 MG PO TABS
5.0000 mg | ORAL_TABLET | Freq: Every day | ORAL | Status: DC
  Administered 2021-01-23: 5 mg via ORAL
  Filled 2021-01-23: qty 1

## 2021-01-23 NOTE — Lactation Note (Addendum)
This note was copied from a baby's chart. Lactation Consultation Note  Patient Name: Kari Dunn [id number] Date: 01/23/2021 Reason for consult: Follow-up assessment;Mother's request;Difficult latch;Primapara;1st time breastfeeding;Early term 37-38.6wks Age: 21 hrs LC called to assist with latch. Mom's breasts are inverted. Mom wearing breast shells but nipples are still inverted. LC attempted a tea cup hold on the right breast. Infant not really able to sustain the latch. 20 NS used infant latched for 6 minutes. LC switched infant to other side, using NS size 20 infant latched for 10 minutes.   Mom flange size increased in 27. Dad showed how to pace bottle feed DBM 10 ml taking and infant burped. Dad offering more DBM as infant still showing cues.  Mom will pump using dEBP q 3 hrs for 15 minutes.   LC reviewed feeding plan with parents.  All questions answered at the end of the visit.   Maternal Data    Feeding Mother's Current Feeding Choice: Breast Milk and Donor Milk Nipple Type: Extra Slow Flow  LATCH Score Latch: Repeated attempts needed to sustain latch, nipple held in mouth throughout feeding, stimulation needed to elicit sucking reflex.  Audible Swallowing: Spontaneous and intermittent  Type of Nipple: Everted at rest and after stimulation  Comfort (Breast/Nipple): Soft / non-tender  Hold (Positioning): Assistance needed to correctly position infant at breast and maintain latch.  LATCH Score: 8   Lactation Tools Discussed/Used Tools: Nipple Dorris Carnes;Flanges Nipple shield size: 20 Flange Size: 27 Breast pump type: Double-Electric Breast Pump Pump Education: Setup, frequency, and cleaning;Milk Storage Reason for Pumping: increase stimulation Pumping frequency: every 3 hrs for 15 minutes  Interventions Interventions: Breast feeding basics reviewed;Assisted with latch;Skin to skin;Breast massage;Breast compression;Adjust position;Support pillows;Position  options;DEBP;Education  Discharge Pump: DEBP;Manual  Consult Status Consult Status: Follow-up Date: 01/24/21 Follow-up type: In-patient    Kari Dunn  Kari Dunn 01/23/2021, 2:28 PM

## 2021-01-23 NOTE — Progress Notes (Addendum)
Post Partum Day 1 Subjective:  Patient comfortable in bed, baby in basinet. Dad in room for support.  Patient complains of back pain from epidural. no complaints, up ad lib, voiding, tolerating PO and + flatus   Denies difficulty breathing, respiratory distress, chest pain, excessive bleeding, headache, and leg pain or swelling.  Objective:  Blood pressure 133/83, pulse 72, temperature 97.7 F (36.5 C), temperature source Oral, resp. rate 18, height $RemoveBe'5\' 7"'MpmSHBCsA$  (1.702 m), weight 98 kg, last menstrual period 05/07/2020, SpO2 100 %, unknown if currently breastfeeding.  Physical Exam:   General: alert, cooperative and no distress Lochia: appropriate Uterine Fundus: firm Incision: n/a DVT Evaluation: No evidence of DVT seen on physical exam. Negative Homan's sign. No cords or calf tenderness. No significant calf/ankle edema.  Recent Labs    01/21/21 1940 01/22/21 0826  HGB 10.3* 9.8*  HCT 31.1* 28.5*    Assessment/Plan:  Plan for discharge tomorrow, Breastfeeding, Circumcision prior to discharge and Contraception unsure    Lactation is consulting with patient to assist with breastfeeding.  Encouraged patient to request heating pad for back.  CNM started patient on Norvasc, will monitor.    LOS: 2 days   Daneil Dan, [place] 01/23/2021, 7:49 AM    I personally saw and evaluated the patient, performing the key elements of the service. I developed and verified the management plan that is described in the resident's/student's note, and I agree with the content with my edits above. VSS, HRR&R, Resp unlabored, Legs neg.  Nigel Berthold, CNM 01/25/2021 10:52 AM

## 2021-01-23 NOTE — Lactation Note (Signed)
This note was copied from a baby's chart. Lactation Consultation Note  Patient Name: Kari Dunn [id number] Date: 01/23/2021 Reason for consult: Early term 37-38.6wks Age:21 hours  P1 mother whose infant is now 81 hours old.  This is an ETI at 37+1 weeks.  Mother's feeding preference on admission was breast, however, she has not been very interested in breast feeding.  She is supplementing with donor breast milk.  Upon chart review I noticed that baby has not been feeding every three hours and volume supplementation amounts have been minimal.  Reviewed the LPTI guidelines with mother and devised a feeding plan for today.    Mother was going to "wait" until her milk "came in" to breast feed.  Educated her on milk coming to volume and how it is preferable to latch with every feeding, followed by supplementation and pumping.  Explained in detail the process.  Suggested she awaken father who was sleeping on the couch, to assist with the supplementation every three hours.  Baby had nippled 5 mls of donor milk at 1000.  I offered to feed him more to get the volume between 10-20 mls.  Mother approved and stated she was not able to get him to consume any more.  I demonstrated burping and  he easily consumed another 10 mls, burped again and went to sleep.  Swaddled well and placed in bassinet.  Observed mother pumping and reviewed pump and settings.  Instructed mother on how to observe flanges and to change to a larger flange size if needed.  Reinforced the importance of feeding baby at least every three hours or sooner if he desires.  Asked mother to call her RN if she has any difficulty feeding or needs latch assistance.  At this time, mother is not interested in latching.  She will awaken baby by 1400 if he does not self awaken prior to this time.  RN updated.    Maternal Data    Feeding Mother's Current Feeding Choice: Breast Milk and Donor Milk Nipple Type: Extra Slow Flow  LATCH Score                     Lactation Tools Discussed/Used Tools: Pump;Flanges Flange Size: 24 Breast pump type: Double-Electric Breast Pump;Manual Pump Education: Setup, frequency, and cleaning;Milk Storage (Reviewed) Reason for Pumping: stimulation and supplementation for ETI Pumping frequency: Every three hours  Interventions Interventions: Education  Discharge Pump: DEBP;Manual  Consult Status Consult Status: Follow-up Date: 01/24/21 Follow-up type: In-patient    [name] R [name] 01/23/2021, 11:29 AM

## 2021-01-24 ENCOUNTER — Other Ambulatory Visit: Payer: Self-pay | Admitting: Family Medicine

## 2021-01-24 MED ORDER — COCONUT OIL OIL: 1.0000 "application " | TOPICAL_OIL | 0 refills | Status: AC | PRN

## 2021-01-24 MED ORDER — AMLODIPINE BESYLATE 5 MG PO TABS
10.0000 mg | ORAL_TABLET | Freq: Every day | ORAL | Status: DC
  Administered 2021-01-24: 10 mg via ORAL
  Filled 2021-01-24: qty 2

## 2021-01-24 MED ORDER — FERROUS SULFATE 325 (65 FE) MG PO TABS
325.0000 mg | ORAL_TABLET | ORAL | Status: DC
  Administered 2021-01-24: 325 mg via ORAL
  Filled 2021-01-24: qty 1

## 2021-01-24 MED ORDER — ASCORBIC ACID 500 MG PO TABS
250.0000 mg | ORAL_TABLET | ORAL | Status: DC
  Administered 2021-01-24: 250 mg via ORAL
  Filled 2021-01-24: qty 1

## 2021-01-24 MED ORDER — IBUPROFEN 600 MG PO TABS: 600.0000 mg | ORAL_TABLET | Freq: Four times a day (QID) | ORAL | Status: AC

## 2021-01-24 MED ORDER — FERROUS SULFATE 325 (65 FE) MG PO TABS: 325.0000 mg | ORAL_TABLET | Freq: Every day | ORAL | 0 refills | Status: AC

## 2021-01-24 MED ORDER — ACETAMINOPHEN 325 MG PO TABS: 650.0000 mg | ORAL_TABLET | ORAL | Status: AC | PRN

## 2021-01-24 MED ORDER — AMLODIPINE BESYLATE 10 MG PO TABS: 10.0000 mg | ORAL_TABLET | Freq: Every day | ORAL | 0 refills | Status: AC

## 2021-01-24 MED FILL — FERROUS SULFATE 325 MG TAB: 325 (65 FE) | 60 days supply | Qty: 60 | Fill #0

## 2021-01-24 MED FILL — AMLODIPINE BESYLATE 10 MG T: 10 | 30 days supply | Qty: 30 | Fill #0

## 2021-01-24 NOTE — Discharge Instructions (Signed)
-take tylenol 1000 mg every 6 hours as needed for pain, alternate with ibuprofen 600 mg every 6 hours -drink plenty of water to help with breastfeeding -continue prenatal vitamins while you are breastfeeding -take iron pills every other day with vitamin c, this will help healing as well as breast feeding -think about birth control options-->bedisider.org is a great website! You can get any form of birth control from the health department for free   Postpartum Care After Vaginal Delivery The following information offers guidance about how to care for yourself from the time you deliver your baby to 6-12 weeks after delivery (postpartum period). If you have problems or questions, contact your health care provider for more specific instructions. Follow these instructions at home: Vaginal bleeding  It is normal to have vaginal bleeding (lochia) after delivery. Wear a sanitary pad for bleeding and discharge. ? During the first week after delivery, the amount and appearance of lochia is often similar to a menstrual period. ? Over the next few weeks, it will gradually decrease to a dry, yellow-brown discharge. ? For most women, lochia stops completely by 4-6 weeks after delivery, but can vary.  Change your sanitary pads frequently. Watch for any changes in your flow, such as: ? A sudden increase in volume. ? A change in color. ? Large blood clots.  If you pass a blood clot from your vagina, save it and call your health care provider. Do not flush blood clots down the toilet before talking with your health care provider.  Do not use tampons or douches until your health care provider approves.  If you are not breastfeeding, your period should return 6-8 weeks after delivery. If you are feeding your baby breast milk only, your period may not return until you stop breastfeeding. Perineal care  Keep the area between the vagina and the anus (perineum) clean and dry. Use medicated pads and  pain-relieving sprays and creams as directed.  If you had a surgical cut in the perineum (episiotomy) or a tear, check the area for signs of infection until you are healed. Check for: ? More redness, swelling, or pain. ? Fluid or blood coming from the cut or tear. ? Warmth. ? Pus or a bad smell.  You may be given a squirt bottle to use instead of wiping to clean the perineum area after you use the bathroom. Pat the area gently to dry it.  To relieve pain caused by an episiotomy, a tear, or swollen veins in the anus (hemorrhoids), take a warm sitz bath 2-3 times a day. In a sitz bath, the warm water should only come up to your hips and cover your buttocks.   Breast care  In the first few days after delivery, your breasts may feel heavy, full, and uncomfortable (breast engorgement). Milk may also leak from your breasts. Ask your health care provider about ways to help relieve the discomfort.  If you are breastfeeding: ? Wear a bra that supports your breasts and fits well. Use breast pads to absorb milk that leaks. ? Keep your nipples clean and dry. Apply creams and ointments as told. ? You may have uterine contractions every time you breastfeed for up to several weeks after delivery. This helps your uterus return to its normal size. ? If you have any problems with breastfeeding, notify your health care provider or lactation consultant.  If you are not breastfeeding: ? Avoid touching your breasts. Do not squeeze out (express) milk. Doing this can make your   breasts produce more milk. ? Wear a good-fitting bra and use cold packs to help with swelling. Intimacy and sexuality  Ask your health care provider when you can engage in sexual activity. This may depend upon: ? Your risk of infection. ? How fast you are healing. ? Your comfort and desire to engage in sexual activity.  You are able to get pregnant after delivery, even if you have not had your period. Talk with your health care provider  about methods of birth control (contraception) or family planning if you desire future pregnancies. Medicines  Take over-the-counter and prescription medicines only as told by your health care provider.  Take an over-the-counter stool softener to help ease bowel movements as told by your health care provider.  If you were prescribed an antibiotic medicine, take it as told by your health care provider. Do not stop taking the antibiotic even if you start to feel better.  Review all previous and current prescriptions to check for possible transfer into breast milk. Activity  Gradually return to your normal activities as told by your health care provider.  Rest as much as possible. Nap while your baby is sleeping. Eating and drinking  Drink enough fluid to keep your urine pale yellow.  To help prevent or relieve constipation, eat high-fiber foods every day.  Choose healthy eating to support breastfeeding or weight loss goals.  Take your prenatal vitamins until your health care provider tells you to stop.   General tips/recommendations  Do not use any products that contain nicotine or tobacco. These products include cigarettes, chewing tobacco, and vaping devices, such as e-cigarettes. If you need help quitting, ask your health care provider.  Do not drink alcohol, especially if you are breastfeeding.  Do not take medications or drugs that are not prescribed to you, especially if you are breastfeeding.  Visit your health care provider for a postpartum checkup within the first 3-6 weeks after delivery.  Complete a comprehensive postpartum visit no later than 12 weeks after delivery.  Keep all follow-up visits for you and your baby. Contact a health care provider if:  You feel unusually sad or worried.  Your breasts become red, painful, or hard.  You have a fever or other signs of an infection.  You have bleeding that is soaking through one pad an hour or you have blood  clots.  You have a severe headache that doesn't go away or you have vision changes.  You have nausea and vomiting and are unable to eat or drink anything for 24 hours. Get help right away if:  You have chest pain or difficulty breathing.  You have sudden, severe leg pain.  You faint or have a seizure.  You have thoughts about hurting yourself or your baby. If you ever feel like you may hurt yourself or others, or have thoughts about taking your own life, get help right away. Go to your nearest emergency department or:  Call your local emergency services (911 in the [place]  The National Suicide Prevention Lifeline at 1-800-273-8255. This suicide crisis helpline is open 24 hours a day.  Text the Crisis Text Line at 741741 (in the [place] Summary  The period of time after you deliver your newborn up to 6-12 weeks after delivery is called the postpartum period.  Keep all follow-up visits for you and your baby.  Review all previous and current prescriptions to check for possible transfer into breast milk.  Contact a health care provider if you feel   unusually sad or worried during the postpartum period. This information is not intended to replace advice given to you by your health care provider. Make sure you discuss any questions you have with your health care provider. Document Revised: 07/06/2020 Document Reviewed: 07/06/2020 Elsevier Patient Education  2021 Elsevier Inc.  

## 2021-01-25 ENCOUNTER — Encounter: Payer: Medicaid Other | Admitting: Obstetrics and Gynecology

## 2021-01-29 ENCOUNTER — Other Ambulatory Visit: Payer: Self-pay

## 2021-01-29 ENCOUNTER — Ambulatory Visit (INDEPENDENT_AMBULATORY_CARE_PROVIDER_SITE_OTHER): Payer: Medicaid Other

## 2021-01-29 VITALS — BP 130/84 | HR 88 | Wt 200.0 lb

## 2021-01-29 DIAGNOSIS — Z0130 Encounter for examination of blood pressure without abnormal findings: Secondary | ICD-10-CM

## 2021-01-29 NOTE — Progress Notes (Signed)
Patient was assessed and managed by nursing staff during this encounter. I have reviewed the chart and agree with the documentation and plan. I have also made any necessary editorial changes.  Dierre Crevier, MD 01/29/2021 12:41 PM    

## 2021-01-29 NOTE — Progress Notes (Addendum)
Subjective:  Kari Dunn is a 20 y.o. female here for BP check 1 week PP.   Hypertension ROS: taking medications as instructed, no medication side effects noted, no TIA's, no chest pain on exertion, no dyspnea on exertion and no swelling of ankles.    Objective:  BP 130/84   Pulse 88   Wt 200 lb (90.7 kg)   LMP 05/07/2020   Breastfeeding Yes   BMI 31.32 kg/m   Appearance alert, well appearing, and in no distress. General exam BP noted to be well controlled today in office.    Assessment:   Blood Pressure well controlled.   Plan:  Current treatment plan is effective, no change in therapy. Keep PP appointment, call us if you develop Sx.

## 2021-02-19 ENCOUNTER — Ambulatory Visit: Payer: Medicaid Other | Admitting: Nurse Practitioner

## 2021-03-06 ENCOUNTER — Other Ambulatory Visit: Payer: Self-pay | Admitting: Obstetrics

## 2021-03-07 ENCOUNTER — Other Ambulatory Visit (HOSPITAL_COMMUNITY)
Admission: RE | Admit: 2021-03-07 | Discharge: 2021-03-07 | Disposition: A | Discharge status: Home / Self Care | Payer: Medicaid Other | Source: Ambulatory Visit | Attending: Nurse Practitioner | Admitting: Nurse Practitioner

## 2021-03-07 ENCOUNTER — Ambulatory Visit (INDEPENDENT_AMBULATORY_CARE_PROVIDER_SITE_OTHER): Payer: Medicaid Other | Admitting: Women's Health

## 2021-03-07 ENCOUNTER — Other Ambulatory Visit: Payer: Self-pay

## 2021-03-07 VITALS — BP 125/83 | HR 77 | Wt 200.0 lb

## 2021-03-07 DIAGNOSIS — Z124 Encounter for screening for malignant neoplasm of cervix: Secondary | ICD-10-CM | POA: Diagnosis present

## 2021-03-07 NOTE — Patient Instructions (Addendum)
AREA FAMILY PRACTICE PHYSICIANS  [place] ([address]) . [facility] o [address] [place], [place] [address] o [phone] o Mon-Fri 8:30-12:30, 1:30-5:00 o Accepting Medicaid . [facility] o [address], [place], [place] [address] o [phone] o Mon-Fri 8:00-5:30 . [facility] o [address] [place], [place] [address] o [phone] o Mon, Tue, Thur, Fri 8:30-5:00, Wed 10:00-7:00 (closed 1-2pm) o Accepting Medicaid . [facility] o [address], [place], [place]  [address] o Phone - [phone]   Fax - [phone]  [place] ([address]) . [facility] o [address] [place], [place] [address] o [phone] o Mon-Fri 8:00-5:00 . [facility]  o [address] [place], [place] [address] o [phone] o Mon-Fri 8:30-5:30, Sat (Oct.-Mar.) 9:00-1:00 o Accepting Medicaid  [place] ([address]) . [facility] o [address], [place], [place] [address] o [phone] o Mon-Fri 8:00-5:00  [place] ([address]) . [facility] [employer] o [address], [place], [place] [address] o [phone] o Mon-Fri 8:00-5:00 . [facility] o [address], [place], [place] [address] o [phone] o Mon-Fri 8:00-5:00 . [facility] o [address] [place], [place] [address] o [phone] o Mon-Fri 8:00-5:00 . [facility] o [address] [place] [address] o [phone] o Mon-Fri 7:30-5:30  [place] ([address] & [address]) . [facility] o [address] [place], [place] [address] o [phone] o Mon-Thur 8:00-6:00 o Accepting Medicaid . [facility] o [address] [place], [place] [address] o [phone] o Mon-Thur 7:30-7:30, Fri 7:30-4:30 o Accepting Medicaid . [facility] o [address], [place], [place]  [address] o [phone]   Fax - [phone]  [place] ([address] & [address]) . [facility] o [address] [place], [place] [address] o [phone] o Mon-Fri 7:00-5:00 . [facility] o [address], [place], [place] [address] o [phone] o Mon-Fri 8:00-5:00 o Accepting Medicaid . [facility] o [address],  [place], [place] [address] o [phone] o Mon-Fri 8:00-5:00 o Accepting Medicaid  [place] ([address]) . [facility] [place] o [address] [place], [place] [address] o [phone] o Mon-Fri 8:00-5:00 . [facility] o [address], [place], [place] [address] o [phone] o Mon-Fri 8:00-5:00 o Accepting Medicaid . [facility] o [address], [place], [place] [address] o [phone] o Mon-Fri 8:00-5:30, Sat&Sun by appointment (phones open at 8:30) o Accepting Medicaid  [place] ([address]) . [facility] o [address] [place], [place] [address] o [phone] o Mon-Thur 8:00-7:00, Fri 8:00-5:00, Sat 8:00-12:00, Sun 9:00-12:00 o Accepting Medicaid . [facility] [place] o [address], [place], [place] [address] o [phone] o Mon-Thur 8:00-5:00 o Accepting Medicaid . [facility] o [address] [place],  [address] o [phone] o Mon-Fri 8:00-5:30, Sat (Oct.-Mar.) 9:00-1:00 o Accepting Medicaid  [place]  ([address]) .   [facility] o [address] [place], [place] [address] o [phone] o Mon-Fri 8:00-5:00 o Accepting Medicaid   [facility] [phone]) . [facility] o [address], [place], [place] [address] o [phone] o Mon-Fri 8:00-5:00 . Nature conservation officer at [facility] o [address], [place], [place] [address] o [phone] o Mon-Fri 8:00-5:00 . [facility] [place] o [address], [place], [place] [address] o [phone] o Mon-Fri 8:00-5:00 o After hours clinic [facility] [place], [place] [address]) [phone] Mon-Fri 5:00-8:00, Sat 12:00-6:00, Sun 10:00-4:00 o Accepting Medicaid . [facility] o [address] [place], [place]  [address] o [phone]   Fax - [phone]  [place] [phone]) . Nature conservation officer at [facility] o [address] [place], [place] [address] o [phone] o Mon-Fri 8:00-5:00 . [facility] [place] [facility]) o [address] [place], [place] [address] o [phone] o Mon-Thur 8:00-7:00, Fri 8:00-5:00, Sat 8:00-12:00        [email or web address] - SLYND

## 2021-03-07 NOTE — Progress Notes (Signed)
Pt is 6 weeks pp.  Pt had vaginal delivery with Epidural.  Pt is using Daron Offer along with breast feeding. No bowel/urinary complaints.  Pt is undecided about BC, has not had intercourse.   Pt does not have PCP. Pt is taking Amlodipine, BP well controlled in office today.   EPDS score 3 today.

## 2021-03-07 NOTE — Progress Notes (Signed)
Post Partum Visit Note  Kari Dunn is a 20 y.o. G52P1001 female who presents for a postpartum visit. She is 6 weeks postpartum following a normal spontaneous vaginal delivery.  I have fully reviewed the prenatal and intrapartum course. The delivery was at 37.1 gestational weeks.  Anesthesia: epidural. Postpartum course has been normal. Baby is doing well. Baby is feeding by both breast and bottle - Gerber. Bleeding staining only. Bowel function is normal. Bladder function is normal. Patient is not sexually active. Contraception method is none. Postpartum depression screening: negative.  The pregnancy intention screening data noted above was reviewed. Potential methods of contraception were discussed. The patient elected to proceed with No Method - Other Reason.    Edinburgh Postnatal Depression Scale - 03/07/21 1405      Edinburgh Postnatal Depression Scale:  In the Past 7 Days   I have been able to laugh and see the funny side of things. 0    I have looked forward with enjoyment to things. 0    I have blamed myself unnecessarily when things went wrong. 0    I have been anxious or worried for no good reason. 2    I have felt scared or panicky for no good reason. 0    Things have been getting on top of me. 1    I have been so unhappy that I have had difficulty sleeping. 0    I have felt sad or miserable. 0    I have been so unhappy that I have been crying. 0    The thought of harming myself has occurred to me. 0    Edinburgh Postnatal Depression Scale Total 3           Health Maintenance Due  Topic Date Due  . COVID-19 Vaccine (1) Never done  . HPV VACCINES (1 - 2-dose series) Never done  . PAP-Cervical Cytology Screening  02/14/2021  . PAP SMEAR-Modifier  02/14/2021    The following portions of the patient's history were reviewed and updated as appropriate: allergies, current medications, past family history, past medical history, past social history, past surgical history  and problem list.  Review of Systems Pertinent items noted in HPI and remainder of comprehensive ROS otherwise negative.  Objective:  BP 125/83   Pulse 77   Wt 200 lb (90.7 kg)   LMP 05/07/2020   BMI 31.32 kg/m    General:  alert, cooperative and no distress   Breasts:  pt declined  Lungs: clear to auscultation bilaterally  Heart:  regular rate and rhythm, S1, S2 normal, no murmur, click, rub or gallop  Abdomen: soft, non-tender; bowel sounds normal; no masses,  no organomegaly   Wound n/a  GU exam:  normal       Assessment:    1. Screening for cervical cancer -Pap smear done  Normal postpartum exam.   Plan:   Essential components of care per ACOG recommendations:  1.  Mood and well being: Patient with negative depression screening today. Reviewed local resources for support.  - Patient tobacco use? No.   - hx of drug use? No.    2. Infant care and feeding:  -Patient currently breastmilk feeding? Yes. Discussed returning to work and pumping. Reviewed importance of draining breast regularly to support lactation. Patient needs a work note. Patient was provided letter for work to allow for every 2-3 hr pumping breaks, and to be granted a private location to express breastmilk and refrigerated area to  store breastmilk.  -Social determinants of health (SDOH) reviewed in EPIC.  3. Sexuality, contraception and birth spacing - Patient does not know want a pregnancy in the next year.  Desired family size is 2 children.  - Reviewed forms of contraception in tiered fashion. Patient desired no method today.  Patient wishes to do additional research, bedsider.org website given, pt encouraged to call with questions/when decided on method for either RX of pill or apointment for insertion of LARC/Depo. Advised on abstinence/condoms while deciding on method. - Discussed birth spacing of 18 months  4. Sleep and fatigue -Encouraged family/partner/community support of 4 hrs of uninterrupted  sleep to help with mood and fatigue  5. Physical Recovery  - Discussed patients delivery and complications. She describes her labor as good. - Patient had a Vaginal, no problems at delivery. Patient had a minor laceration. Perineal healing reviewed. Patient expressed understanding - Patient has urinary incontinence? No. - Patient is safe to resume physical and sexual activity  6.  Health Maintenance - HM due items addressed Yes - Last pap smear No results found for: DIAGPAP Pap smear done at today's visit.  -Breast Cancer screening indicated? No.   7. Chronic Disease/Pregnancy Condition follow up: Hypertension - pt taking BP medication daily, advised to continue - PCP follow up within 1 month  Marylen Ponto, NP [facility] [employer], [facility]

## 2021-03-08 LAB — CYTOLOGY - PAP: Diagnosis: NEGATIVE

## 2021-03-27 ENCOUNTER — Ambulatory Visit: Payer: Medicaid Other | Admitting: Certified Nurse Midwife

## 2021-04-05 IMAGING — US US MFM OB DETAIL+14 WK
1 series · 13 of 28 positions shown · non-contrast
Comparison: none

[Series 1: us mfm ob detail+14 wk · 104 acquisitions, 13 frames shown]
[im 4/104]
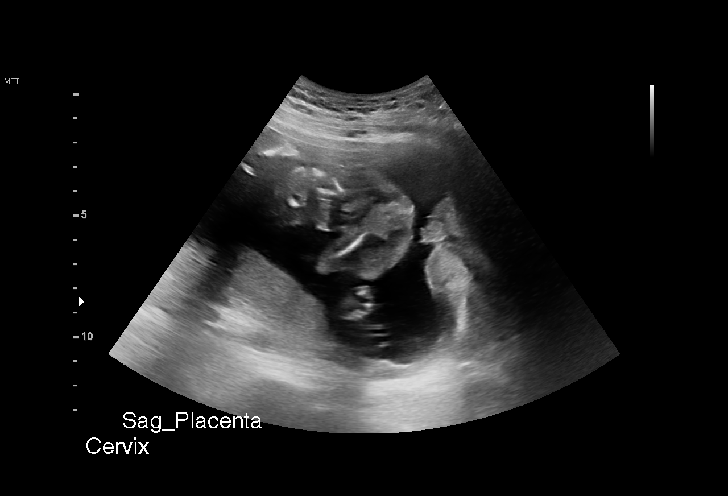
[im 12/104]
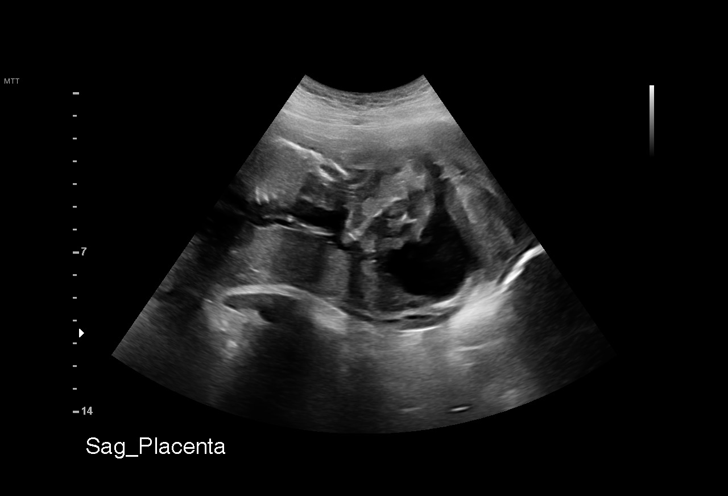
[im 20/104]
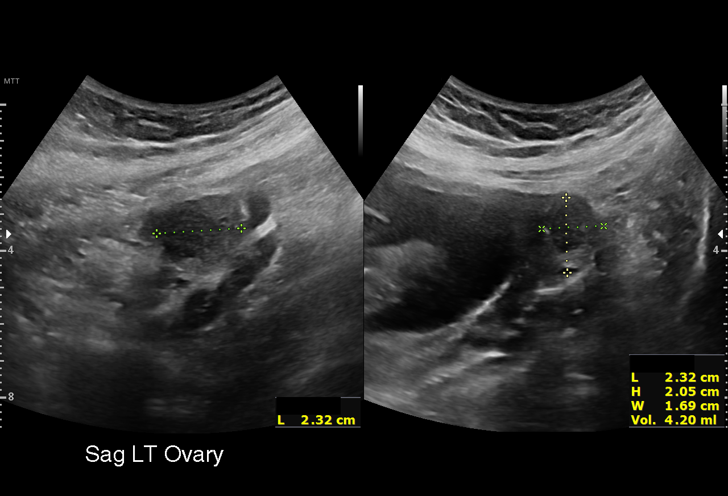
[im 27/104]
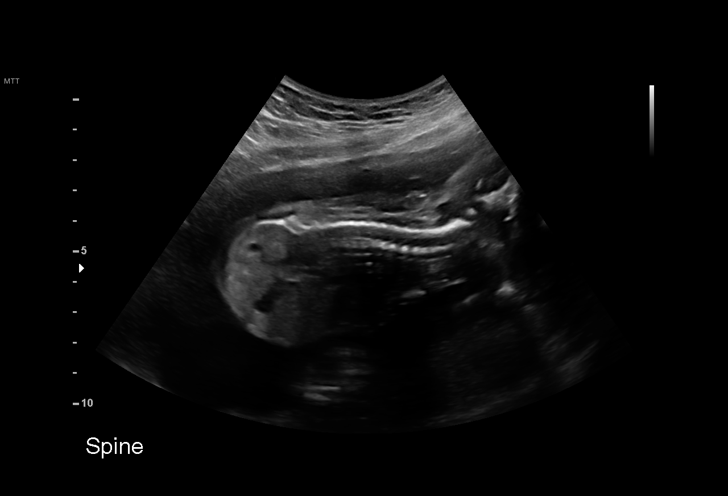
[im 35/104]
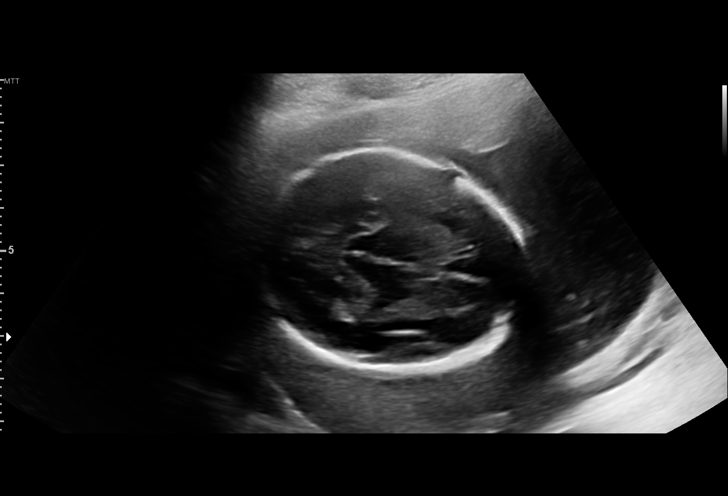
[im 42/104]
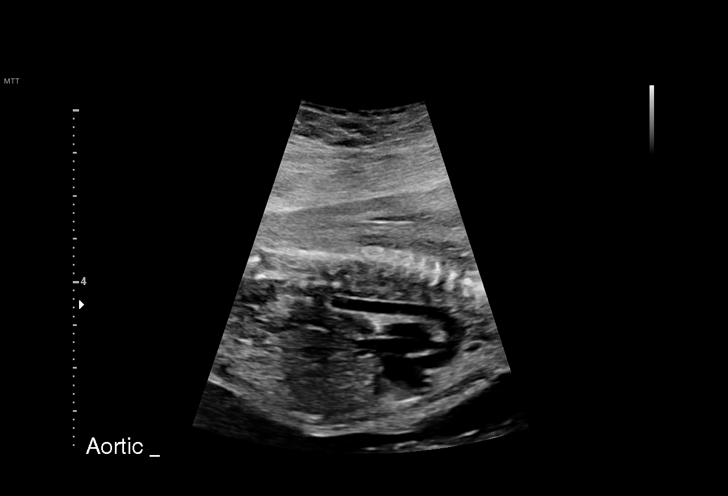
[im 54/104]
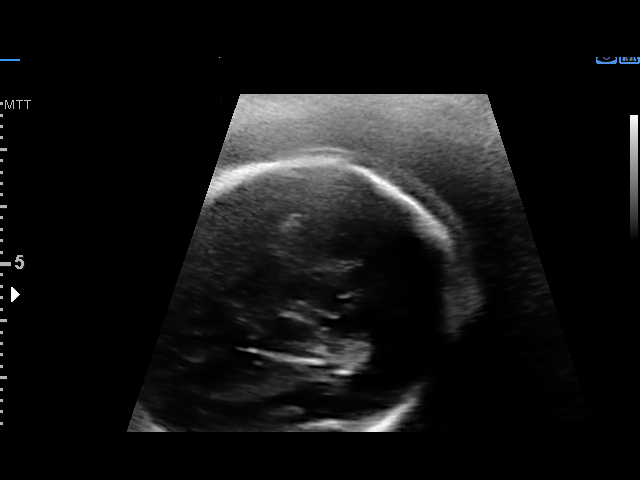
[im 62/104]
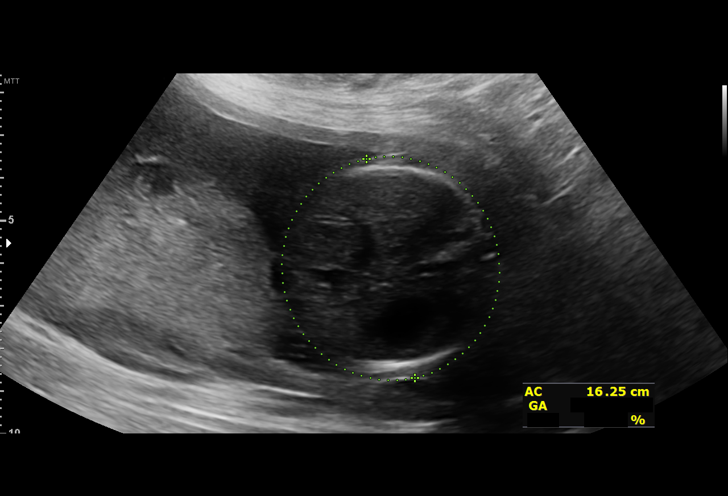
[im 69/104]
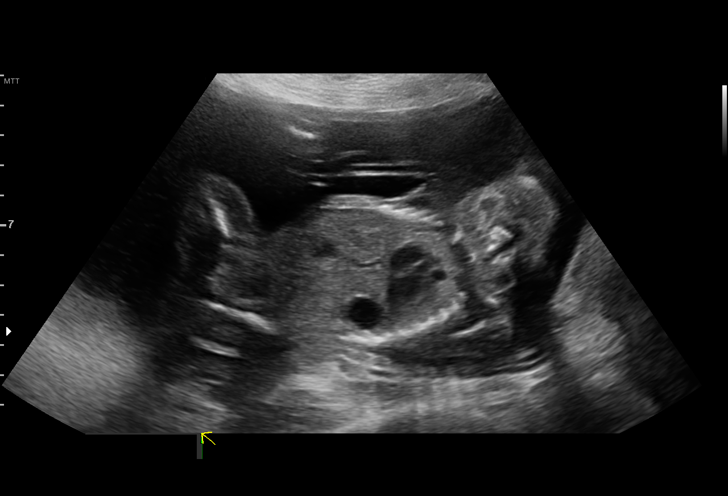
[im 77/104]
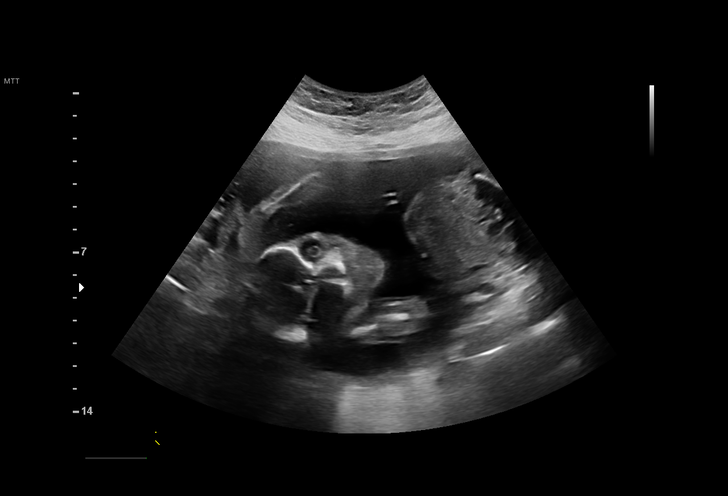
[im 84/104]
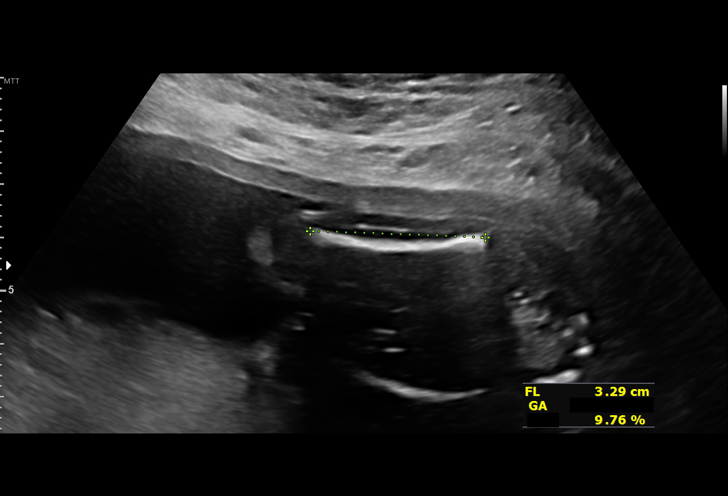
[im 92/104]
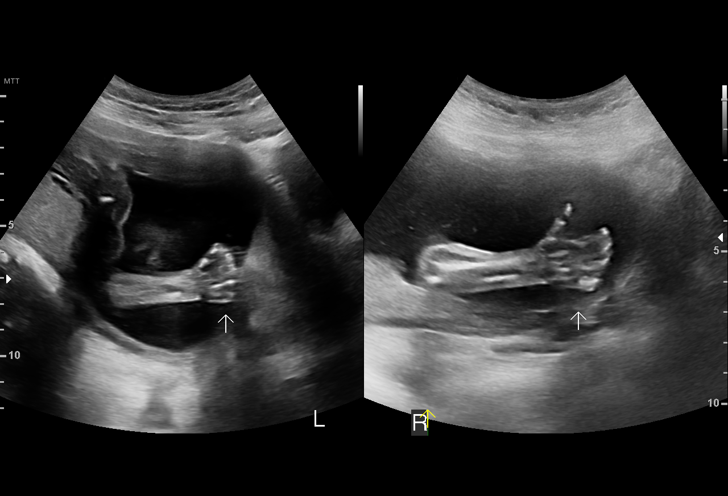
[im 100/104]
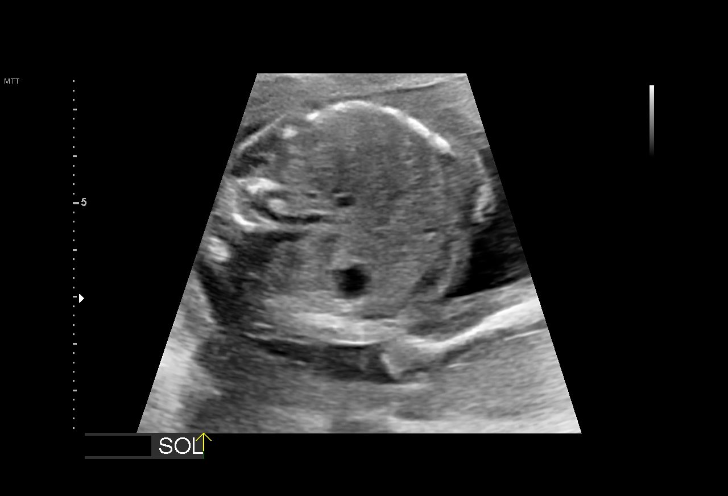

[13 of 28 positions shown; findings below may reference images not displayed]

[facility]                                   [HOSPITAL] at

Indications

 21 weeks gestation of pregnancy
 Anemia during pregnancy in second trimester
 Genetic carrier (Yvng Tuttle)
 Encounter for antenatal screening for
 malformations
 Late prenatal care, second trimester
Fetal Evaluation

 Num Of Fetuses:         1
 Fetal Heart Rate(bpm):  153
 Cardiac Activity:       Observed
 Presentation:           Cephalic
 Placenta:               Posterior
 P. Cord Insertion:      Visualized, central

 Amniotic Fluid
 AFI FV:      Within normal limits

                             Largest Pocket(cm)

Biometry

 BPD:      51.1  mm     G. Age:  21w 3d         51  %    CI:        79.06   %    70 - 86
                                                         FL/HC:      18.3   %    15.9 -
 HC:      181.7  mm     G. Age:  20w 4d         10  %    HC/AC:      1.10        1.06 -
 AC:      164.6  mm     G. Age:  21w 4d         45  %    FL/BPD:     65.0   %
 FL:       33.2  mm     G. Age:  20w 3d         12  %    FL/AC:      20.2   %    20 - 24
 HUM:      31.4  mm     G. Age:  20w 3d         22  %
 CER:      21.9  mm     G. Age:  20w 4d         38  %
 NFT:       4.7  mm

 LV:        6.7  mm
 CM:          4  mm

 Est. FW:     391  gm    0 lb 14 oz      23  %
OB History

 Blood Type:   O+
 Gravidity:    1         Term:   0        Prem:   0        SAB:   0
 TOP:          0       Ectopic:  0        Living: 0
Gestational Age

 LMP:           21w 3d        Date:  05/07/20                 EDD:   02/11/21
 Clinical EDD:  21w 3d                                        EDD:   02/11/21
 U/S Today:     21w 0d                                        EDD:   02/14/21
 Best:          21w 3d     Det. By:  LMP  (05/07/20)          EDD:   02/11/21
Anatomy

 Cranium:               Appears normal         Aortic Arch:            Appears normal
 Cavum:                 Appears normal         Ductal Arch:            Not well visualized
 Ventricles:            Appears normal         Diaphragm:              Appears normal
 Choroid Plexus:        Appears normal         Stomach:                Appears normal, left
                                                                       sided
 Cerebellum:            Appears normal         Abdomen:                Appears normal
 Posterior Fossa:       Appears normal         Abdominal Wall:         Appears nml (cord
                                                                       insert, abd wall)
 Nuchal Fold:           Appears normal         Cord Vessels:           Appears normal (3
                                                                       vessel cord)
 Face:                  Orbits nl; profile not Kidneys:                Appear normal
                        well visualized
 Lips:                  Not well visualized    Bladder:                Appears normal
 Thoracic:              Appears normal         Spine:                  Appears normal
 Heart:                 Appears normal         Upper Extremities:      Appears normal
                        (4CH, axis, and
                        situs)
 RVOT:                  Not well visualized    Lower Extremities:      Appears normal
 LVOT:                  Not well visualized

 Other:  Fetus appears to be a male. Right Open hand visualized. Heels
         appear normal.
Cervix Uterus Adnexa

 Cervix
 Length:           3.14  cm.
 Normal appearance by transabdominal scan.

 Uterus
 No abnormality visualized.

 Right Ovary
 Within normal limits.
 Left Ovary
 Within normal limits.

 Cul De Sac
 No free fluid seen.

 Adnexa
 No abnormality visualized.
Impression

 G1 P0. Patient is here for fetal anatomy scan. Patient reports
 she had ultrasound at 8 weeks' gestation that established her
 EDD.

 On cell-free fetal DNA screening, the risks of fetal
 aneuploidies are not increased .

 We performed fetal anatomy scan. No makers of
 aneuploidies or fetal structural defects are seen. Fetal
 biometry is consistent with her previously-established dates.
 Amniotic fluid is normal and good fetal activity is seen.
 Patient understands the limitations of ultrasound in detecting
 fetal anomalies.
Recommendations

 -An appointment was made for her to return in 4 weeks for
 completion of fetal anatomy.
                 Tiger, Es

## 2021-05-10 IMAGING — US US MFM OB FOLLOW-UP
1 series · 13 of 28 positions shown · non-contrast
Comparison: none

[Series 1: us mfm ob follow-up · 13 of 60 slices shown]
[im 3/60]
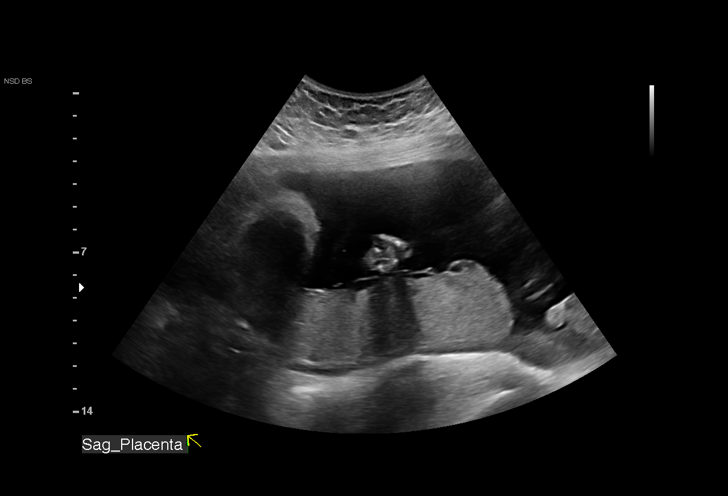
[im 7/60]
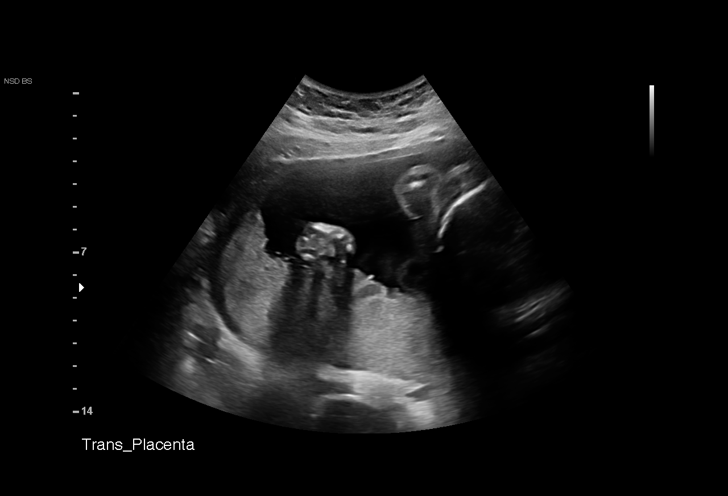
[im 11/60]
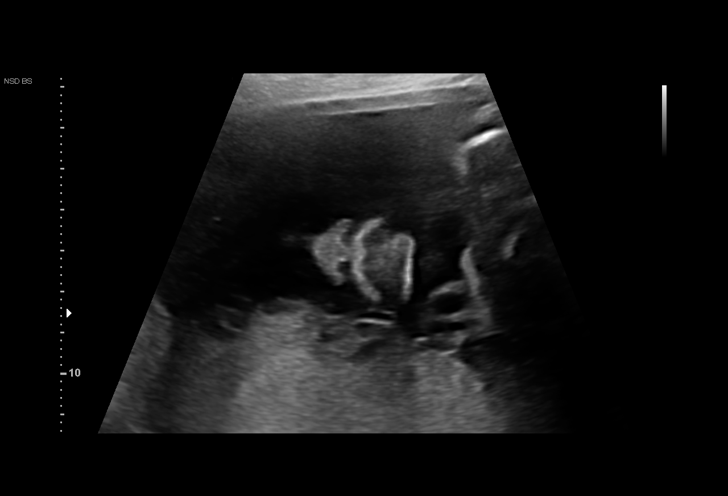
[im 16/60]
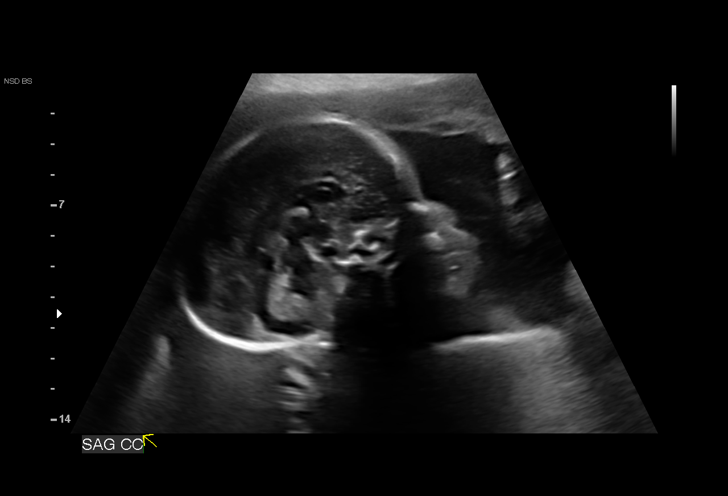
[im 20/60]
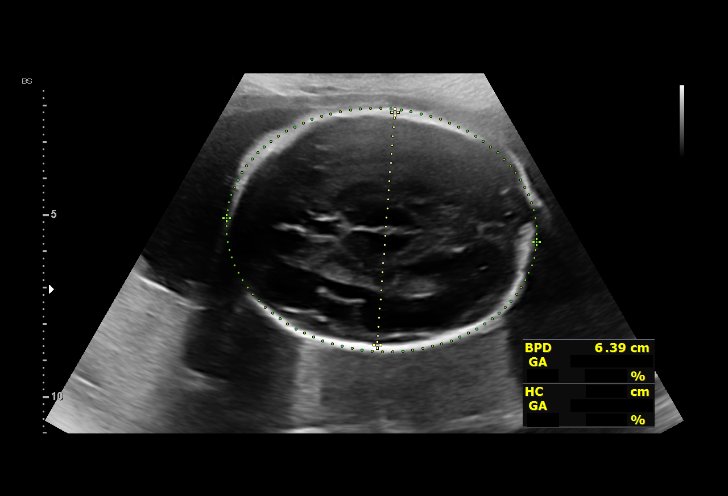
[im 25/60]
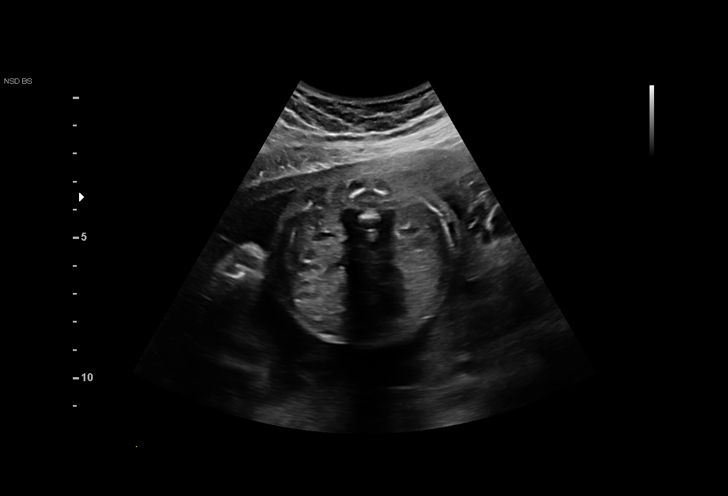
[im 31/60]
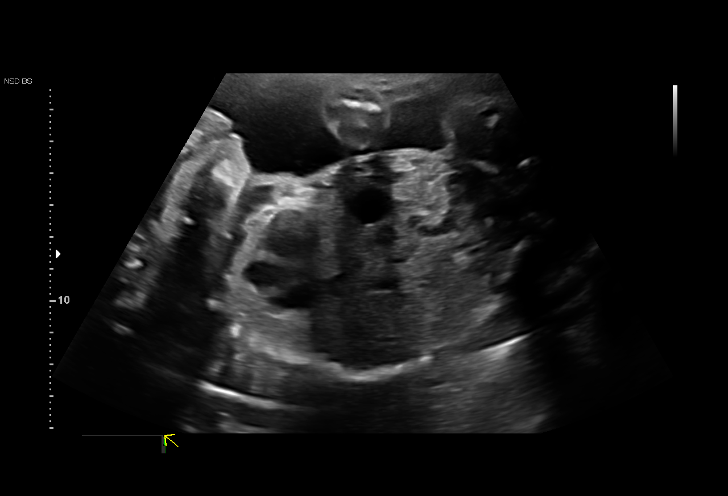
[im 35/60]
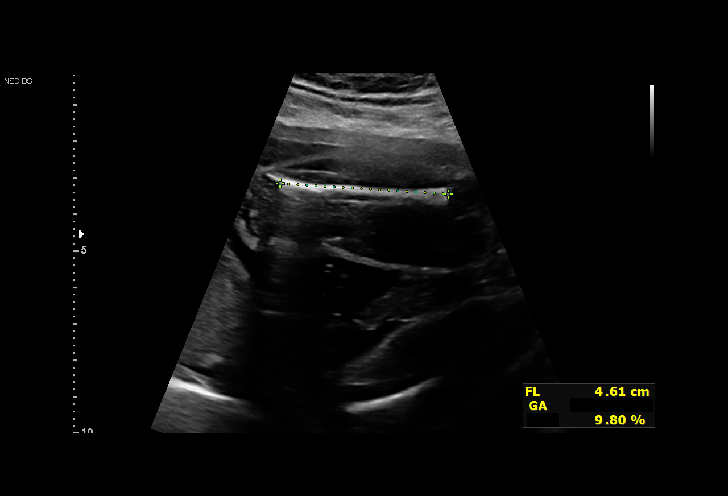
[im 40/60]
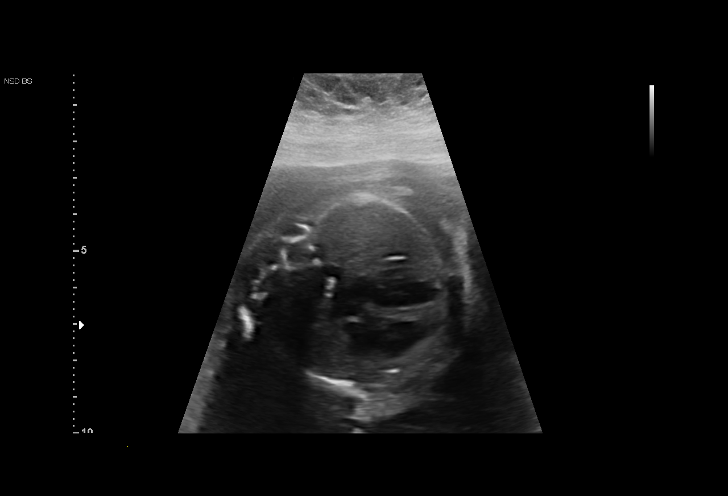
[im 44/60]
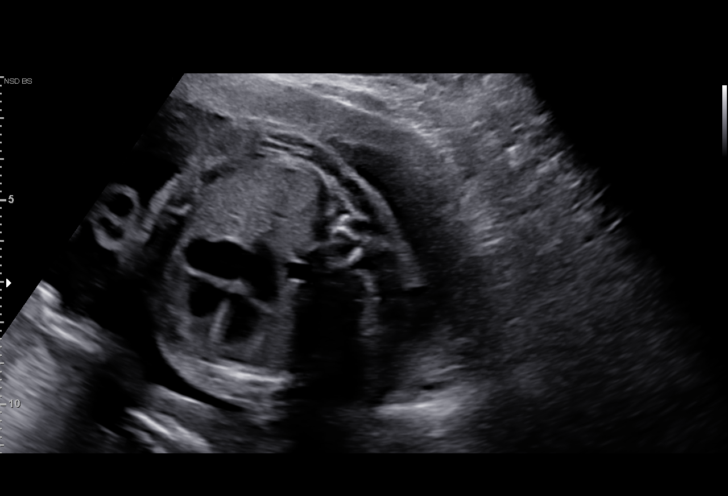
[im 49/60]
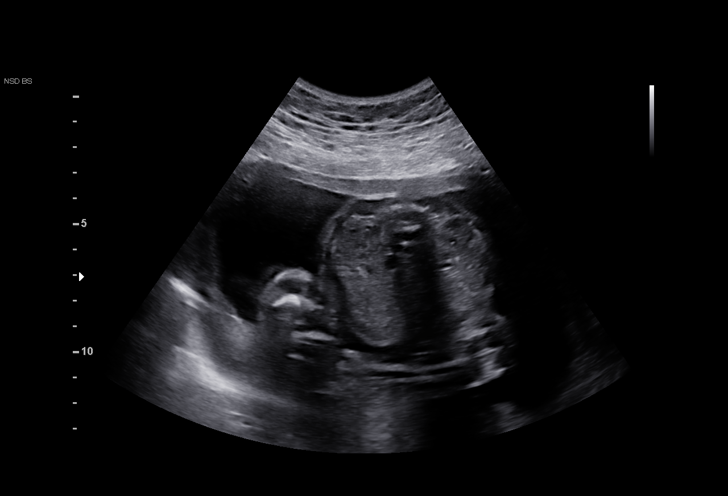
[im 53/60]
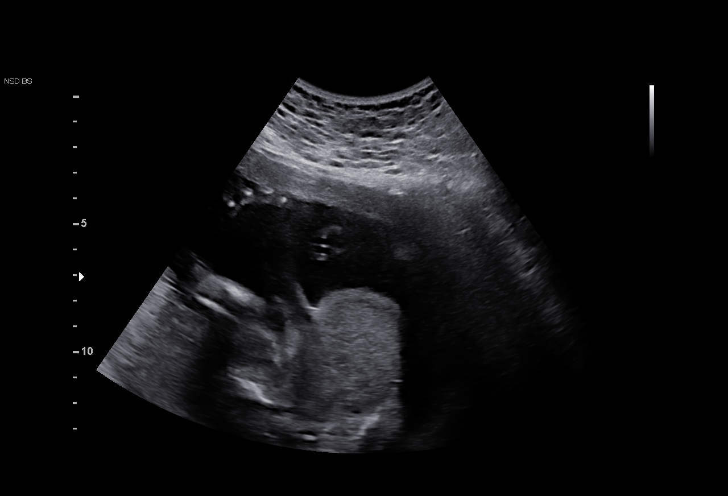
[im 57/60]
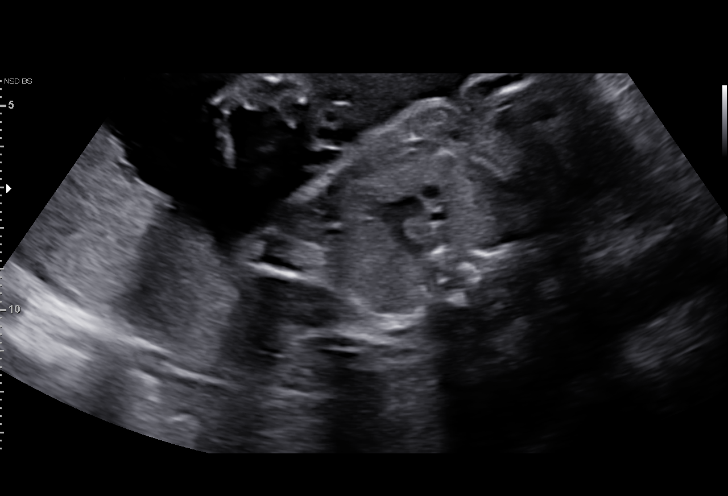

[13 of 28 positions shown; findings below may reference images not displayed]

[facility]                                   [HOSPITAL] at

Indications

 Anemia during pregnancy in second trimester
 26 weeks gestation of pregnancy
 Genetic carrier (Rufina Tiul)
 Encounter for antenatal screening for
 malformations
 Late prenatal care, second trimester
 Antenatal follow-up for nonvisualized fetal
 anatomy
Fetal Evaluation

 Num Of Fetuses:         1
 Fetal Heart Rate(bpm):  152
 Cardiac Activity:       Observed
 Presentation:           Breech
 Placenta:               Posterior
 P. Cord Insertion:      Previously seen as normal

 Amniotic Fluid
 AFI FV:      Within normal limits

                             Largest Pocket(cm)

Biometry
 BPD:      64.2  mm     G. Age:  26w 0d         24  %    CI:        71.38   %    70 - 86
                                                         FL/HC:      19.3   %    18.6 -
 HC:       242   mm     G. Age:  26w 2d         20  %    HC/AC:      1.08        1.04 -
 AC:      224.3  mm     G. Age:  26w 6d         54  %    FL/BPD:     72.9   %    71 - 87
 FL:       46.8  mm     G. Age:  25w 4d         14  %    FL/AC:      20.9   %    20 - 24
 HUM:      43.5  mm     G. Age:  25w 6d         35  %
 LV:        4.2  mm

 Est. FW:     917  gm           2 lb     33  %
OB History

 Blood Type:   O+
 Gravidity:    1         Term:   0        Prem:   0        SAB:   0
 TOP:          0       Ectopic:  0        Living: 0
Gestational Age

 LMP:           26w 3d        Date:  05/07/20                 EDD:   02/11/21
 Clinical EDD:  26w 3d                                        EDD:   02/11/21
 U/S Today:     26w 1d                                        EDD:   02/13/21
 Best:          26w 3d     Det. By:  LMP  (05/07/20)          EDD:   02/11/21
Anatomy

 Cranium:               Appears normal         Aortic Arch:            Previously seen
 Cavum:                 Previously seen        Ductal Arch:            Not well visualized
 Ventricles:            Appears normal         Diaphragm:              Appears normal
 Choroid Plexus:        Previously seen        Stomach:                Appears normal, left
                                                                       sided
 Cerebellum:            Previously seen        Abdomen:                Appears normal
 Posterior Fossa:       Previously seen        Abdominal Wall:         Previously seen
 Nuchal Fold:           Previously seen        Cord Vessels:           Previously seen
 Face:                  Orbits previously      Kidneys:                Appear normal
                        seen. Profile WNL
 Lips:                  Appears normal         Bladder:                Appears normal
 Thoracic:              Appears normal         Spine:                  Previously seen
 Heart:                 Appears normal         Upper Extremities:      Previously seen
                        (4CH, axis, and
                        situs)
 RVOT:                  Not well visualized    Lower Extremities:      Previously seen
 LVOT:                  Not well visualized

 Other:  Male gender previously seen. Heels previously seen. Right Open
         hand previously visualized.
Cervix Uterus Adnexa

 Cervix
 Not visualized (advanced GA >16wks)

 Right Ovary
 Previously seen

 Left Ovary
 Previously seen.
Comments

 This patient was seen for a follow up exam as the views of
 the fetal anatomy were unable to be fully visualized during
 her last ultrasound exam.  She denies any problems since
 her last exam.
 She was informed that the fetal growth and amniotic fluid
 level appears appropriate for her gestational age.
 Although there were no obvious anomalies noted today, the
 views of the fetal anatomy remain suboptimal due to the fetal
 position.
 A follow up exam was scheduled in 4 weeks to try to
 complete the views of the fetal anatomy.
